# Patient Record
Sex: Female | Born: 1990
Health system: Southern US, Community
[De-identification: ages and names within clinical notes are randomized; demographics above are authoritative.]

## PROBLEM LIST (undated history)

## (undated) ENCOUNTER — Inpatient Hospital Stay (HOSPITAL_COMMUNITY): Payer: Self-pay

## (undated) DIAGNOSIS — F329 Major depressive disorder, single episode, unspecified: Secondary | ICD-10-CM

## (undated) DIAGNOSIS — D649 Anemia, unspecified: Secondary | ICD-10-CM

## (undated) DIAGNOSIS — R51 Headache: Secondary | ICD-10-CM

## (undated) DIAGNOSIS — F419 Anxiety disorder, unspecified: Secondary | ICD-10-CM

## (undated) DIAGNOSIS — F32A Depression, unspecified: Secondary | ICD-10-CM

## (undated) HISTORY — PX: WISDOM TOOTH EXTRACTION: SHX21

---
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
## 2010-01-15 ENCOUNTER — Emergency Department (HOSPITAL_COMMUNITY): Admission: EM | Admit: 2010-01-15 | Discharge: 2010-01-15 | Payer: Self-pay | Admitting: Emergency Medicine

## 2010-04-20 ENCOUNTER — Emergency Department (HOSPITAL_COMMUNITY): Admission: EM | Admit: 2010-04-20 | Discharge: 2010-04-21 | Payer: Self-pay | Admitting: Emergency Medicine

## 2010-08-10 LAB — DIFFERENTIAL
Lymphs Abs: 1.8 10*3/uL (ref 0.7–4.0)
Monocytes Absolute: 0.4 10*3/uL (ref 0.1–1.0)
Neutro Abs: 1.6 10*3/uL — ABNORMAL LOW (ref 1.7–7.7)

## 2010-08-10 LAB — URINALYSIS, ROUTINE W REFLEX MICROSCOPIC
Bilirubin Urine: NEGATIVE
Glucose, UA: NEGATIVE mg/dL
Ketones, ur: NEGATIVE mg/dL
Nitrite: NEGATIVE
Protein, ur: NEGATIVE mg/dL
Specific Gravity, Urine: 1.028 (ref 1.005–1.030)

## 2010-08-10 LAB — BASIC METABOLIC PANEL
BUN: 6 mg/dL (ref 6–23)
GFR calc non Af Amer: >60 mL/min (ref >60)
Glucose, Bld: 105 mg/dL — ABNORMAL HIGH (ref 70–99)
Potassium: 3.4 mEq/L — ABNORMAL LOW (ref 3.5–5.1)
Sodium: 140 mEq/L (ref 135–145)

## 2010-08-10 LAB — URINE MICROSCOPIC-ADD ON

## 2010-08-10 LAB — CBC
MCHC: 33.6 g/dL (ref 30.0–36.0)
Platelets: 198 10*3/uL (ref 150–400)
RBC: 4.33 MIL/uL (ref 3.87–5.11)
WBC: 3.9 10*3/uL — ABNORMAL LOW (ref 4.0–10.5)

## 2010-08-10 LAB — GC/CHLAMYDIA PROBE AMP, GENITAL
Chlamydia, DNA Probe: POSITIVE — AB
GC Probe Amp, Genital: NEGATIVE

## 2010-08-10 LAB — WET PREP, GENITAL
Trich, Wet Prep: NONE SEEN
Yeast Wet Prep HPF POC: NONE SEEN

## 2010-08-10 LAB — POCT PREGNANCY, URINE: Preg Test, Ur: NEGATIVE

## 2010-08-13 LAB — DIFFERENTIAL
Basophils Absolute: 0 10*3/uL (ref 0.0–0.1)
Eosinophils Absolute: 0.1 10*3/uL (ref 0.0–0.7)
Eosinophils Relative: 2 % (ref 0–5)
Lymphocytes Relative: 45 % (ref 12–46)
Neutro Abs: 2.4 10*3/uL (ref 1.7–7.7)

## 2010-08-13 LAB — URINALYSIS, ROUTINE W REFLEX MICROSCOPIC
Bilirubin Urine: NEGATIVE
Glucose, UA: NEGATIVE mg/dL
Ketones, ur: NEGATIVE mg/dL
Protein, ur: NEGATIVE mg/dL
Specific Gravity, Urine: 1.003 — ABNORMAL LOW (ref 1.005–1.030)

## 2010-08-13 LAB — POCT I-STAT, CHEM 8
Calcium, Ion: 1.24 mmol/L (ref 1.12–1.32)
Chloride: 103 mEq/L (ref 96–112)
Creatinine, Ser: 0.7 mg/dL (ref 0.4–1.2)
Hemoglobin: 13.6 g/dL (ref 12.0–15.0)
Potassium: 3.5 mEq/L (ref 3.5–5.1)
TCO2: 24 mmol/L (ref 0–100)

## 2010-08-13 LAB — CBC
MCH: 30.4 pg (ref 26.0–34.0)
MCHC: 34.7 g/dL (ref 30.0–36.0)
MCV: 87.6 fL (ref 78.0–100.0)
Platelets: 174 10*3/uL (ref 150–400)
WBC: 5 10*3/uL (ref 4.0–10.5)

## 2010-08-13 LAB — POCT PREGNANCY, URINE: Preg Test, Ur: NEGATIVE

## 2010-08-13 LAB — WET PREP, GENITAL: Trich, Wet Prep: NONE SEEN

## 2010-10-01 ENCOUNTER — Other Ambulatory Visit: Payer: Self-pay | Admitting: Hematology and Oncology

## 2010-10-01 ENCOUNTER — Encounter (HOSPITAL_BASED_OUTPATIENT_CLINIC_OR_DEPARTMENT_OTHER): Payer: Medicaid Other | Admitting: Hematology and Oncology

## 2010-10-01 DIAGNOSIS — D539 Nutritional anemia, unspecified: Secondary | ICD-10-CM

## 2010-10-01 LAB — URINALYSIS, MICROSCOPIC - CHCC
Bilirubin (Urine): NEGATIVE
Glucose: NEGATIVE g/dL
Ketones: NEGATIVE mg/dL
Nitrite: NEGATIVE
Specific Gravity, Urine: 1.025 (ref 1.003–1.035)
pH: 6 (ref 4.6–8.0)

## 2010-10-01 LAB — CBC & DIFF AND RETIC
Basophils Absolute: 0 10*3/uL (ref 0.0–0.1)
Eosinophils Absolute: 0.1 10*3/uL (ref 0.0–0.5)
HCT: 35.2 % (ref 34.8–46.6)
LYMPH%: 49.5 % (ref 14.0–49.7)
RDW: 12.9 % (ref 11.2–14.5)
Retic %: 0.47 % — ABNORMAL LOW (ref 0.50–1.50)
Retic Ct Abs: 19.79 10*3/uL (ref 18.30–72.70)
lymph#: 2.1 10*3/uL (ref 0.9–3.3)

## 2010-10-01 LAB — MORPHOLOGY: PLT EST: ADEQUATE

## 2010-10-05 LAB — HAPTOGLOBIN: Haptoglobin: 172 mg/dL (ref 16–200)

## 2010-10-05 LAB — PROTEIN ELECTROPHORESIS, SERUM
Alpha-2-Globulin: 9.8 % (ref 7.1–11.8)
Beta Globulin: 7 % (ref 4.7–7.2)
Gamma Globulin: 17.4 % (ref 11.1–18.8)
Total Protein, Serum Electrophoresis: 7.3 g/dL (ref 6.0–8.3)

## 2010-10-05 LAB — IRON AND TIBC
TIBC: 375 ug/dL (ref 250–470)
UIBC: 341 ug/dL

## 2010-10-05 LAB — HEMOGLOBINOPATHY EVALUATION
Hemoglobin Other: 0 % (ref 0.0–0.0)
Hgb A2 Quant: 2.9 % (ref 2.2–3.2)
Hgb A: 97.1 % (ref 96.8–97.8)
Hgb S Quant: 0 % (ref 0.0–0.0)

## 2010-10-05 LAB — COMPREHENSIVE METABOLIC PANEL
Albumin: 4.3 g/dL (ref 3.5–5.2)
BUN: 9 mg/dL (ref 6–23)
Calcium: 9 mg/dL (ref 8.4–10.5)
Chloride: 105 mEq/L (ref 96–112)
Glucose, Bld: 84 mg/dL (ref 70–99)
Potassium: 3.7 mEq/L (ref 3.5–5.3)
Total Protein: 7.3 g/dL (ref 6.0–8.3)

## 2010-10-08 ENCOUNTER — Encounter (HOSPITAL_BASED_OUTPATIENT_CLINIC_OR_DEPARTMENT_OTHER): Payer: Medicaid Other | Admitting: Hematology and Oncology

## 2010-10-08 DIAGNOSIS — D509 Iron deficiency anemia, unspecified: Secondary | ICD-10-CM

## 2010-10-18 ENCOUNTER — Encounter: Payer: Medicaid Other | Admitting: Genetic Counselor

## 2010-11-04 ENCOUNTER — Other Ambulatory Visit: Payer: Self-pay | Admitting: Hematology and Oncology

## 2010-11-04 ENCOUNTER — Encounter (HOSPITAL_BASED_OUTPATIENT_CLINIC_OR_DEPARTMENT_OTHER): Payer: Medicaid Other | Admitting: Hematology and Oncology

## 2010-11-04 DIAGNOSIS — D539 Nutritional anemia, unspecified: Secondary | ICD-10-CM

## 2010-11-04 LAB — CBC WITH DIFFERENTIAL/PLATELET
BASO%: 0.6 % (ref 0.0–2.0)
EOS%: 3.2 % (ref 0.0–7.0)
Eosinophils Absolute: 0.1 10*3/uL (ref 0.0–0.5)
LYMPH%: 56 % — ABNORMAL HIGH (ref 14.0–49.7)
MCH: 30.2 pg (ref 25.1–34.0)
MCHC: 34.5 g/dL (ref 31.5–36.0)
MCV: 87.4 fL (ref 79.5–101.0)
MONO%: 9.1 % (ref 0.0–14.0)
Platelets: 134 10*3/uL — ABNORMAL LOW (ref 145–400)
RBC: 3.67 10*6/uL — ABNORMAL LOW (ref 3.70–5.45)
RDW: 14.1 % (ref 11.2–14.5)

## 2010-11-04 LAB — BASIC METABOLIC PANEL
BUN: 8 mg/dL (ref 6–23)
Calcium: 9.4 mg/dL (ref 8.4–10.5)
Creatinine, Ser: 0.77 mg/dL (ref 0.50–1.10)
Glucose, Bld: 94 mg/dL (ref 70–99)
Potassium: 3.6 mEq/L (ref 3.5–5.3)

## 2010-11-04 LAB — IRON AND TIBC
Iron: 73 ug/dL (ref 42–145)
UIBC: 194 ug/dL

## 2010-11-04 LAB — FERRITIN: Ferritin: 336 ng/mL — ABNORMAL HIGH (ref 10–291)

## 2010-11-30 ENCOUNTER — Other Ambulatory Visit: Payer: Self-pay | Admitting: Hematology and Oncology

## 2010-11-30 ENCOUNTER — Encounter (HOSPITAL_BASED_OUTPATIENT_CLINIC_OR_DEPARTMENT_OTHER): Payer: Medicaid Other | Admitting: Hematology and Oncology

## 2010-11-30 DIAGNOSIS — D539 Nutritional anemia, unspecified: Secondary | ICD-10-CM

## 2010-11-30 DIAGNOSIS — D509 Iron deficiency anemia, unspecified: Secondary | ICD-10-CM

## 2010-11-30 LAB — CBC WITH DIFFERENTIAL/PLATELET
Basophils Absolute: 0 10*3/uL (ref 0.0–0.1)
EOS%: 1.9 % (ref 0.0–7.0)
HCT: 33.8 % — ABNORMAL LOW (ref 34.8–46.6)
HGB: 11.6 g/dL (ref 11.6–15.9)
LYMPH%: 51.2 % — ABNORMAL HIGH (ref 14.0–49.7)
MCH: 30.2 pg (ref 25.1–34.0)
NEUT%: 38.2 % — ABNORMAL LOW (ref 38.4–76.8)
Platelets: 176 10*3/uL (ref 145–400)
lymph#: 1.8 10*3/uL (ref 0.9–3.3)

## 2011-05-18 ENCOUNTER — Telehealth: Payer: Self-pay | Admitting: *Deleted

## 2011-05-18 ENCOUNTER — Other Ambulatory Visit: Payer: Self-pay | Admitting: *Deleted

## 2011-05-18 DIAGNOSIS — D539 Nutritional anemia, unspecified: Secondary | ICD-10-CM

## 2011-05-18 NOTE — Telephone Encounter (Signed)
Patient feeling weak and tired again as when she 1st saw Dr. Dalene Carrow. Has been out of her prescription iron for past 3 months. Needs appointment to see MD and refill.  Has FTKA for #2 prior appointments since last visit. Will have her come in tomorrow for the repeat labs MD needed and then schedule follow up based on these results.

## 2011-05-19 ENCOUNTER — Telehealth: Payer: Self-pay | Admitting: Oncology

## 2011-05-19 ENCOUNTER — Other Ambulatory Visit: Payer: Medicaid Other | Admitting: Lab

## 2011-05-19 ENCOUNTER — Ambulatory Visit (HOSPITAL_BASED_OUTPATIENT_CLINIC_OR_DEPARTMENT_OTHER): Payer: Medicaid Other

## 2011-05-19 DIAGNOSIS — D539 Nutritional anemia, unspecified: Secondary | ICD-10-CM

## 2011-05-19 LAB — FERRITIN: Ferritin: 186 ng/mL (ref 10–291)

## 2011-05-19 LAB — CBC WITH DIFFERENTIAL/PLATELET
Eosinophils Absolute: 0.1 10*3/uL (ref 0.0–0.5)
HCT: 35.5 % (ref 34.8–46.6)
LYMPH%: 55.1 % — ABNORMAL HIGH (ref 14.0–49.7)
MONO#: 0.2 10*3/uL (ref 0.1–0.9)
NEUT#: 1.2 10*3/uL — ABNORMAL LOW (ref 1.5–6.5)
Platelets: 152 10*3/uL (ref 145–400)
RBC: 3.94 10*6/uL (ref 3.70–5.45)
WBC: 3.2 10*3/uL — ABNORMAL LOW (ref 3.9–10.3)

## 2011-05-19 LAB — IRON AND TIBC
%SAT: 24 % (ref 20–55)
TIBC: 285 ug/dL (ref 250–470)
UIBC: 216 ug/dL (ref 125–400)

## 2011-05-19 LAB — BASIC METABOLIC PANEL
Calcium: 9.3 mg/dL (ref 8.4–10.5)
Chloride: 105 mEq/L (ref 96–112)
Creatinine, Ser: 0.8 mg/dL (ref 0.50–1.10)

## 2011-05-19 NOTE — Telephone Encounter (Signed)
S/w the pt and she is aware of her lab appt for today

## 2011-05-30 ENCOUNTER — Telehealth: Payer: Self-pay | Admitting: Nurse Practitioner

## 2011-05-30 NOTE — Telephone Encounter (Signed)
Pt called requesting lab result from 12/20.  Informed pt that CBC showed normal hemoglobin.  Pt requested refill of iron prescribed by Dr. Dalene Carrow.  Informed pt that this office unable to refill medication until she is seen due to not showing at previously scheduled appointments.  Let pt know request for followup appointment would be sent to Dr. Dalene Carrow and this office will call her with appt as ordered.  Pt verbalized understanding.

## 2011-05-31 NOTE — L&D Delivery Note (Signed)
Delivery Note At 12:58 AM [name] was delivered via Vaginal, Spontaneous Delivery (Presentation: Left Occiput Posterior).    Placenta status: Intact,delivered w/cord traction .  Cord: 3 vessels with the following complications: None.    Anesthesia:  Epidural, local  Episiotomy:  none Lacerations:  2nd degree Suture Repair: 2.0 vicryl rapide Est. Blood Loss (mL):  200 ml  Mom to postpartum.  Baby to nursery-stable.  JACKSON-MOORE,Tracey Whitney 02/23/2012, 1:31 AM

## 2011-06-01 ENCOUNTER — Other Ambulatory Visit: Payer: Self-pay | Admitting: Hematology and Oncology

## 2011-06-06 ENCOUNTER — Telehealth: Payer: Self-pay | Admitting: Hematology and Oncology

## 2011-06-06 NOTE — Telephone Encounter (Signed)
Not able to reach pt by phone due to primary phone d/c and vm 2nd # not set up. Jan appt schedule mailed today.

## 2011-06-14 ENCOUNTER — Other Ambulatory Visit: Payer: Self-pay | Admitting: *Deleted

## 2011-06-14 DIAGNOSIS — D539 Nutritional anemia, unspecified: Secondary | ICD-10-CM

## 2011-06-15 ENCOUNTER — Ambulatory Visit: Payer: Medicaid Other | Admitting: Physician Assistant

## 2011-06-15 ENCOUNTER — Other Ambulatory Visit: Payer: Medicaid Other

## 2011-10-10 ENCOUNTER — Inpatient Hospital Stay (HOSPITAL_COMMUNITY)
Admission: AD | Admit: 2011-10-10 | Discharge: 2011-10-10 | Disposition: A | Discharge status: Hospice | Payer: Medicaid Other | Source: Ambulatory Visit | Attending: Obstetrics & Gynecology | Admitting: Obstetrics & Gynecology

## 2011-10-10 ENCOUNTER — Encounter (HOSPITAL_COMMUNITY): Payer: Self-pay | Admitting: *Deleted

## 2011-10-10 DIAGNOSIS — R112 Nausea with vomiting, unspecified: Secondary | ICD-10-CM

## 2011-10-10 DIAGNOSIS — O210 Mild hyperemesis gravidarum: Secondary | ICD-10-CM | POA: Insufficient documentation

## 2011-10-10 HISTORY — DX: Headache: R51

## 2011-10-10 HISTORY — DX: Anemia, unspecified: D64.9

## 2011-10-10 LAB — URINALYSIS, ROUTINE W REFLEX MICROSCOPIC
Hgb urine dipstick: NEGATIVE
Leukocytes, UA: NEGATIVE
Nitrite: NEGATIVE
Specific Gravity, Urine: 1.01 (ref 1.005–1.030)
Urobilinogen, UA: 0.2 mg/dL (ref 0.0–1.0)

## 2011-10-10 MED ORDER — ONDANSETRON 8 MG PO TBDP: 8.0000 mg | ORAL_TABLET | Freq: Three times a day (TID) | ORAL | Status: AC | PRN

## 2011-10-10 MED ORDER — ONDANSETRON 8 MG PO TBDP
8.0000 mg | ORAL_TABLET | Freq: Once | ORAL | Status: AC
  Administered 2011-10-10: 8 mg via ORAL
  Filled 2011-10-10: qty 1

## 2011-10-10 NOTE — Discharge Instructions (Signed)
B.R.A.T. Diet  Your doctor has recommended the B.R.A.T. diet for you or your child until the condition improves. This is often used to help control diarrhea and vomiting symptoms. If you or your child can tolerate clear liquids, you may have:   Bananas.   Rice.   Applesauce.   Toast (and other simple starches such as crackers, potatoes, noodles).  Be sure to avoid dairy products, meats, and fatty foods until symptoms are better. Fruit juices such as apple, grape, and prune juice can make diarrhea worse. Avoid these. Continue this diet for 2 days or as instructed by your caregiver.  Document Released: 05/16/2005 Document Revised: 05/05/2011 Document Reviewed: 11/02/2006  Exit[employer] Patient Information 2012 [employer].    B.R.A.T. Diet  Your doctor has recommended the B.R.A.T. diet for you or your child until the condition improves. This is often used to help control diarrhea and vomiting symptoms. If you or your child can tolerate clear liquids, you may have:   Bananas.   Rice.   Applesauce.   Toast (and other simple starches such as crackers, potatoes, noodles).  Be sure to avoid dairy products, meats, and fatty foods until symptoms are better. Fruit juices such as apple, grape, and prune juice can make diarrhea worse. Avoid these. Continue this diet for 2 days or as instructed by your caregiver.  Document Released: 05/16/2005 Document Revised: 05/05/2011 Document Reviewed: 11/02/2006  [employer] Patient Information 2012 [employer].

## 2011-10-10 NOTE — Progress Notes (Signed)
   List of prenatal providers given.   Prenatal Care [facility]    [facility]  [phone]     Phone: [phone]          [facility]                      [facility]  @[place]     Phone: [phone]  Phone: [phone]         [facility]     Phone: [phone]  Phone: [phone]           [facility] [place]                hone: [phone]  Phone: [phone]         [facility] Dr. Francoise Ceo      Phone: [phone]  Phone: [phone]         [facility] Dept[facility]                Phone: [phone]  [facility]   Phone:[phone]    Family [address] [place])          Phone: [phone] [facility]   Phone: [phone]

## 2011-10-10 NOTE — MAU Provider Note (Signed)
  History     CSN: [id number]  Arrival date and time: 10/10/11 6644   First Provider Initiated Contact with Patient 10/10/11 2111      Chief Complaint  Patient presents with  . Nausea   HPI Pt is here with report of nausea x 10 days.  Reports drinking a drink 7 days ago at [employer] that contained alcohol.  Pt states that she was unaware that the drink contained alcohol and she has been sick every day since.  Report vomiting 3-4 x a days since the occurrence.  Denies fever, body aches, or chills.  No reports of bleeding or leaking of fluid.    Past Medical History  Diagnosis Date  . Headache   . Anemia     Past Surgical History  Procedure Date  . Wisdom tooth extraction     Family History  Problem Relation Age of Onset  . Cancer Paternal Aunt     History  Substance Use Topics  . Smoking status: Never Smoker   . Smokeless tobacco: Not on file  . Alcohol Use: No    Allergies: No Known Allergies  Prescriptions prior to admission  Medication Sig Dispense Refill  . butalbital-acetaminophen-caffeine (FIORICET, ESGIC) 50-325-40 MG per tablet Take 1 tablet by mouth 2 (two) times daily as needed. For migraines.      . Prenatal Vit-Fe Fumarate-FA (PRENATAL MULTIVITAMIN) TABS Take 1 tablet by mouth at bedtime.        Review of Systems  Gastrointestinal: Positive for nausea and vomiting.  All other systems reviewed and are negative.   Physical Exam   Blood pressure 105/57, pulse 75, temperature 98 F (36.7 C), temperature source Oral, resp. rate 18, height 5\' 10"  (1.778 m), weight 73.936 kg (163 lb), SpO2 100.00%.  Physical Exam  Constitutional: She is oriented to person, place, and time. She appears well-developed and well-nourished. No distress.  HENT:  Head: Normocephalic.  Mouth/Throat: Mucous membranes are normal. Mucous membranes are not dry.  Neck: Normal range of motion. Neck supple.  Cardiovascular: Normal rate, regular rhythm and normal heart sounds.     Respiratory: Effort normal and breath sounds normal.  GI: Soft. There is no tenderness.       Hyperactive bowel sounds  Genitourinary: No bleeding around the vagina.  Neurological: She is alert and oriented to person, place, and time.  Skin: Skin is warm and dry.    MAU Course  Procedures Results for orders placed during the hospital encounter of 10/10/11 (from the past 24 hour(s))  URINALYSIS, ROUTINE W REFLEX MICROSCOPIC     Status: Normal   Collection Time   10/10/11  7:30 PM      Component Value Range   Color, Urine YELLOW  YELLOW    APPearance CLEAR  CLEAR    Specific Gravity, Urine 1.010  1.005 - 1.030    pH 7.0  5.0 - 8.0    Glucose, UA NEGATIVE  NEGATIVE (mg/dL)   Hgb urine dipstick NEGATIVE  NEGATIVE    Bilirubin Urine NEGATIVE  NEGATIVE    Ketones, ur NEGATIVE  NEGATIVE (mg/dL)   Protein, ur NEGATIVE  NEGATIVE (mg/dL)   Urobilinogen, UA 0.2  0.0 - 1.0 (mg/dL)   Nitrite NEGATIVE  NEGATIVE    Leukocytes, UA NEGATIVE  NEGATIVE    Doppler FHR 140's  Assessment and Plan  Nausea and Vomiting - stable  Plan: DC to home RX Zofran BRAT diet  [facility] 10/10/2011, 9:15 PM

## 2011-10-10 NOTE — MAU Note (Signed)
Pt reports that the other day she drank a mixed drink that she thought did not have alcohol but was later told that it did contain alcohol and since that time she has been nauseated. Vomiting 3-4 times per day. Was getting prenatal care in [place] and has move here in the last week.

## 2011-10-13 NOTE — MAU Provider Note (Signed)
Medical Screening exam and patient care preformed by advanced practice provider.  Agree with the above management.  

## 2011-11-17 ENCOUNTER — Inpatient Hospital Stay (HOSPITAL_COMMUNITY)
Admission: AD | Admit: 2011-11-17 | Discharge: 2011-11-17 | Disposition: A | Discharge status: Home / Self Care | Payer: Medicaid Other | Source: Ambulatory Visit | Attending: Obstetrics & Gynecology | Admitting: Obstetrics & Gynecology

## 2011-11-17 ENCOUNTER — Encounter (HOSPITAL_COMMUNITY): Payer: Self-pay | Admitting: *Deleted

## 2011-11-17 DIAGNOSIS — N949 Unspecified condition associated with female genital organs and menstrual cycle: Secondary | ICD-10-CM

## 2011-11-17 DIAGNOSIS — R109 Unspecified abdominal pain: Secondary | ICD-10-CM

## 2011-11-17 DIAGNOSIS — K5900 Constipation, unspecified: Secondary | ICD-10-CM | POA: Insufficient documentation

## 2011-11-17 DIAGNOSIS — O99891 Other specified diseases and conditions complicating pregnancy: Secondary | ICD-10-CM | POA: Insufficient documentation

## 2011-11-17 LAB — URINALYSIS, ROUTINE W REFLEX MICROSCOPIC
Ketones, ur: NEGATIVE mg/dL
Leukocytes, UA: NEGATIVE
Nitrite: NEGATIVE
Specific Gravity, Urine: 1.01 (ref 1.005–1.030)
pH: 7 (ref 5.0–8.0)

## 2011-11-17 LAB — WET PREP, GENITAL: Yeast Wet Prep HPF POC: NONE SEEN

## 2011-11-17 NOTE — MAU Provider Note (Signed)
History     CSN: [id number]  Arrival date & time 11/17/11  2010   None     Chief Complaint  Patient presents with  . Vaginal Bleeding    HPI Tracey Whitney is a 21 y.o. female @ [redacted]w[redacted]d gestation who presents to MAU for abdominal cramping. The cramping started 3 days ago. The pain comes and goes, she rates the pain as 8/10 when it comes. The pain at this time in on the sides of the abdomen is a 7/10. The pain is worse when up moving a lot and decreases with rest. Has not taken anything for pain. Prenatal care with Dr. Tamela Oddi. Last office visit 2 weeks ago and everything was fine. The history was provided by the patient.   Past Medical History  Diagnosis Date  . Headache   . Anemia     Past Surgical History  Procedure Date  . Wisdom tooth extraction     Family History  Problem Relation Age of Onset  . Cancer Paternal Aunt     History  Substance Use Topics  . Smoking status: Never Smoker   . Smokeless tobacco: Not on file  . Alcohol Use: No    OB History    Grav Para Term Preterm Abortions TAB SAB Ect Mult Living   2 1 1       1       Review of Systems  Constitutional: Negative for fever, chills, diaphoresis and fatigue.  HENT: Negative for ear pain, congestion, sore throat, facial swelling, neck pain, neck stiffness, dental problem and sinus pressure.   Eyes: Negative for photophobia, pain, discharge and visual disturbance.  Respiratory: Negative for cough, chest tightness and wheezing.   Cardiovascular: Negative for chest pain, palpitations and leg swelling.  Gastrointestinal: Positive for abdominal pain and constipation. Negative for nausea, vomiting, diarrhea and abdominal distention.  Genitourinary: Negative for dysuria, frequency, flank pain, vaginal bleeding, vaginal discharge, difficulty urinating and pelvic pain.  Musculoskeletal: Positive for back pain. Negative for myalgias and gait problem.  Skin: Negative for color change and rash.  Neurological:  Negative for dizziness, speech difficulty, weakness, light-headedness, numbness and headaches.  Psychiatric/Behavioral: Negative for confusion and agitation. The patient is not nervous/anxious.     Allergies  Review of patient's allergies indicates no known allergies.  Home Medications  No current outpatient prescriptions on file.  BP 108/53  Pulse 78  Temp 97.6 F (36.4 C) (Oral)  Resp 18  SpO2 100%  Physical Exam  Nursing note and vitals reviewed. Constitutional: She is oriented to person, place, and time. She appears well-developed and well-nourished. No distress.  HENT:  Head: Normocephalic.  Eyes: EOM are normal.  Neck: Neck supple.  Cardiovascular: Normal rate and regular rhythm.   Pulmonary/Chest: Effort normal and breath sounds normal.  Abdominal: Soft.       Minimal tenderness bilateral consistent with round ligament pain.  Gravid consistent with dates.  Genitourinary:       External genitalia without lesions. White discharge vaginal vault. Cervix closed, thick, posterior. Uterus consistent with dates  Musculoskeletal: Normal range of motion.  Neurological: She is alert and oriented to person, place, and time. No cranial nerve deficit.  Skin: Skin is warm and dry.  Psychiatric: She has a normal mood and affect. Her behavior is normal. Judgment and thought content normal.   Results for orders placed during the hospital encounter of 11/17/11 (from the past 24 hour(s))  URINALYSIS, ROUTINE W REFLEX MICROSCOPIC     Status: Normal  Collection Time   11/17/11  8:25 PM      Component Value Range   Color, Urine YELLOW  YELLOW   APPearance CLEAR  CLEAR   Specific Gravity, Urine 1.010  1.005 - 1.030   pH 7.0  5.0 - 8.0   Glucose, UA NEGATIVE  NEGATIVE mg/dL   Hgb urine dipstick NEGATIVE  NEGATIVE   Bilirubin Urine NEGATIVE  NEGATIVE   Ketones, ur NEGATIVE  NEGATIVE mg/dL   Protein, ur NEGATIVE  NEGATIVE mg/dL   Urobilinogen, UA 0.2  0.0 - 1.0 mg/dL   Nitrite NEGATIVE   NEGATIVE   Leukocytes, UA NEGATIVE  NEGATIVE   EFM:  Baseline 150, no contractions, no decelerations, normal tracing for gestational age.  ED Course  Procedures  Assessment: 21 y.o. female @ [redacted]w[redacted]d gestation with round ligament pain   Constipation  Plan:  Tylenol as needed for discomfort   Diet for constipation   Follow up in the office   Return as needed. MDM

## 2011-11-17 NOTE — MAU Note (Signed)
Patient states she noticed some vaginal bleeding when she wiped after using the bathroom around 630pm. Also complains of back pain and abdominal cramping x 2 days.

## 2011-11-17 NOTE — Discharge Instructions (Signed)
Abdominal Pain During Pregnancy Abdominal discomfort is common in pregnancy. Most of the time, it does not cause harm. There are many causes of abdominal pain. Some causes are more serious than others. Some of the causes of abdominal pain in pregnancy are easily diagnosed. Occasionally, the diagnosis takes time to understand. Other times, the cause is not determined. Abdominal pain can be a sign that something is very wrong with the pregnancy, or the pain may have nothing to do with the pregnancy at all. For this reason, always tell your caregiver if you have any abdominal discomfort. CAUSES Common and harmless causes of abdominal pain include:  Constipation.   Excess gas and bloating.   Round ligament pain. This is pain that is felt in the folds of the groin.   The position the baby or placenta is in.   Baby kicks.   Braxton-Hicks contractions. These are mild contractions that do not cause cervical dilation.  Serious causes of abdominal pain include:  Ectopic pregnancy. This happens when a fertilized egg implants outside of the uterus.   Miscarriage.   Preterm labor. This is when labor starts at less than 37 weeks of pregnancy.   Placental abruption. This is when the placenta partially or completely separates from the uterus.   Preeclampsia. This is often associated with high blood pressure and has been referred to as "toxemia in pregnancy."   Uterine or amniotic fluid infections.  Causes unrelated to pregnancy include:  Urinary tract infection.   Gallbladder stones or inflammation.   Hepatitis or other liver illness.   Intestinal problems, stomach flu, food poisoning, or ulcer.   Appendicitis.   Kidney (renal) stones.   Kidney infection (pylonephritis).  HOME CARE INSTRUCTIONS  For mild pain:  Do not have sexual intercourse or put anything in your vagina until your symptoms go away completely.   Get plenty of rest until your pain improves. If your pain does not  improve in 1 hour, call your caregiver.   Drink clear fluids if you feel nauseous. Avoid solid food as long as you are uncomfortable or nauseous.   Only take medicine as directed by your caregiver.   Keep all follow-up appointments with your caregiver.  SEEK IMMEDIATE MEDICAL CARE IF:  You are bleeding, leaking fluid, or passing tissue from the vagina.   You have increasing pain or cramping.   You have persistent vomiting.   You have painful or bloody urination.   You have a fever.   You notice a decrease in your baby's movements.   You have extreme weakness or feel faint.   You have shortness of breath, with or without abdominal pain.   You develop a severe headache with abdominal pain.   You have abnormal vaginal discharge with abdominal pain.   You have persistent diarrhea.   You have abdominal pain that continues even after rest, or gets worse.  MAKE SURE YOU:   Understand these instructions.   Will watch your condition.   Will get help right away if you are not doing well or get worse.  Document Released: 05/16/2005 Document Revised: 05/05/2011 Document Reviewed: 12/10/2010 [facility] Patient Information 2012 [place], [place].Abdominal Pain During Pregnancy Abdominal discomfort is common in pregnancy. Most of the time, it does not cause harm. There are many causes of abdominal pain. Some causes are more serious than others. Some of the causes of abdominal pain in pregnancy are easily diagnosed. Occasionally, the diagnosis takes time to understand. Other times, the cause is not determined.  Abdominal pain can be a sign that something is very wrong with the pregnancy, or the pain may have nothing to do with the pregnancy at all. For this reason, always tell your caregiver if you have any abdominal discomfort. CAUSES Common and harmless causes of abdominal pain include:  Constipation.   Excess gas and bloating.   Round ligament pain. This is pain that is felt in the  folds of the groin.   The position the baby or placenta is in.   Baby kicks.   Braxton-Hicks contractions. These are mild contractions that do not cause cervical dilation.  Serious causes of abdominal pain include:  Ectopic pregnancy. This happens when a fertilized egg implants outside of the uterus.   Miscarriage.   Preterm labor. This is when labor starts at less than 37 weeks of pregnancy.   Placental abruption. This is when the placenta partially or completely separates from the uterus.   Preeclampsia. This is often associated with high blood pressure and has been referred to as "toxemia in pregnancy."   Uterine or amniotic fluid infections.  Causes unrelated to pregnancy include:  Urinary tract infection.   Gallbladder stones or inflammation.   Hepatitis or other liver illness.   Intestinal problems, stomach flu, food poisoning, or ulcer.   Appendicitis.   Kidney (renal) stones.   Kidney infection (pylonephritis).  HOME CARE INSTRUCTIONS  For mild pain:  Do not have sexual intercourse or put anything in your vagina until your symptoms go away completely.   Get plenty of rest until your pain improves. If your pain does not improve in 1 hour, call your caregiver.   Drink clear fluids if you feel nauseous. Avoid solid food as long as you are uncomfortable or nauseous.   Only take medicine as directed by your caregiver.   Keep all follow-up appointments with your caregiver.  SEEK IMMEDIATE MEDICAL CARE IF:  You are bleeding, leaking fluid, or passing tissue from the vagina.   You have increasing pain or cramping.   You have persistent vomiting.   You have painful or bloody urination.   You have a fever.   You notice a decrease in your baby's movements.   You have extreme weakness or feel faint.   You have shortness of breath, with or without abdominal pain.   You develop a severe headache with abdominal pain.   You have abnormal vaginal discharge  with abdominal pain.   You have persistent diarrhea.   You have abdominal pain that continues even after rest, or gets worse.  MAKE SURE YOU:   Understand these instructions.   Will watch your condition.   Will get help right away if you are not doing well or get worse.  Document Released: 05/16/2005 Document Revised: 05/05/2011 Document Reviewed: 12/10/2010 [facility] Patient Information 2012 [place], [place].

## 2011-11-18 LAB — GC/CHLAMYDIA PROBE AMP, GENITAL: Chlamydia, DNA Probe: NEGATIVE

## 2011-11-23 ENCOUNTER — Inpatient Hospital Stay (HOSPITAL_COMMUNITY)
Admission: AD | Admit: 2011-11-23 | Discharge: 2011-11-23 | Disposition: A | Discharge status: Home / Self Care | Payer: Medicaid Other | Source: Ambulatory Visit | Attending: Obstetrics & Gynecology | Admitting: Obstetrics & Gynecology

## 2011-11-23 ENCOUNTER — Encounter (HOSPITAL_COMMUNITY): Payer: Self-pay | Admitting: Obstetrics and Gynecology

## 2011-11-23 DIAGNOSIS — O2650 Maternal hypotension syndrome, unspecified trimester: Secondary | ICD-10-CM | POA: Insufficient documentation

## 2011-11-23 DIAGNOSIS — I951 Orthostatic hypotension: Secondary | ICD-10-CM

## 2011-11-23 DIAGNOSIS — R55 Syncope and collapse: Secondary | ICD-10-CM

## 2011-11-23 LAB — URINALYSIS, ROUTINE W REFLEX MICROSCOPIC
Glucose, UA: NEGATIVE mg/dL
Ketones, ur: NEGATIVE mg/dL
Leukocytes, UA: NEGATIVE
pH: 6.5 (ref 5.0–8.0)

## 2011-11-23 MED ORDER — LACTATED RINGERS IV SOLN
Freq: Once | INTRAVENOUS | Status: AC
  Administered 2011-11-23: 19:00:00 via INTRAVENOUS

## 2011-11-23 NOTE — MAU Note (Addendum)
At work today (around 1345) and passed out.  Prior to that happening, was standing at the cash register, vision started blurring. Was sweating.  When blacked out hit knee and left side. Head and eyes hurt, no longer blurring.

## 2011-11-23 NOTE — MAU Provider Note (Signed)
History     CSN: [id number]  Arrival date and time: 11/23/11 1711   First Provider Initiated Contact with Patient 11/23/11 1839      Chief Complaint  Patient presents with  . Loss of Consciousness   HPI  Tracey Whitney 21 y.o. [redacted]w[redacted]d patient of Dr. Tamela Oddi presenting today post syncopal episode today at work around 1345.  Patient is a Associate Professor at [employer].  States she works 10 hour shifts on her feet.  States she felt a little lightheaded like she might pass out, but went and drank some water and went back to work.  States became diaphoretic and then fell when she passed out hurting her L knee and hip. States did eat breakfast lunch and dinner and tries to drink enough water but perhaps did not today.  Also reports she had a headache today for which she took a Fioricet.  States history of syncopal episodes with prior pregnancy and tends to get lightheaded if she stands up too quickly.  OB History    Grav Para Term Preterm Abortions TAB SAB Ect Mult Living   2 1 1       1       Past Medical History  Diagnosis Date  . Headache   . Anemia     Past Surgical History  Procedure Date  . Wisdom tooth extraction     Family History  Problem Relation Age of Onset  . Cancer Paternal Aunt     History  Substance Use Topics  . Smoking status: Never Smoker   . Smokeless tobacco: Not on file  . Alcohol Use: No    Allergies: No Known Allergies  Prescriptions prior to admission  Medication Sig Dispense Refill  . butalbital-acetaminophen-caffeine (FIORICET, ESGIC) 50-325-40 MG per tablet Take 1 tablet by mouth 2 (two) times daily as needed. For migraines      . Prenatal Vit-Fe Fumarate-FA (PRENATAL MULTIVITAMIN) TABS Take 1 tablet by mouth at bedtime.        ROS Physical Exam   Blood pressure 99/46, pulse 121, temperature 98.7 F (37.1 C), temperature source Oral, resp. rate 20, height 5\' 8"  (1.727 m), weight 78.472 kg (173 lb).  Physical Exam  MAU Course    Procedures  MDM  Results for orders placed during the hospital encounter of 11/23/11 (from the past 24 hour(s))  URINALYSIS, ROUTINE W REFLEX MICROSCOPIC     Status: Normal   Collection Time   11/23/11  5:25 PM      Component Value Range   Color, Urine YELLOW  YELLOW   APPearance CLEAR  CLEAR   Specific Gravity, Urine 1.010  1.005 - 1.030   pH 6.5  5.0 - 8.0   Glucose, UA NEGATIVE  NEGATIVE mg/dL   Hgb urine dipstick NEGATIVE  NEGATIVE   Bilirubin Urine NEGATIVE  NEGATIVE   Ketones, ur NEGATIVE  NEGATIVE mg/dL   Protein, ur NEGATIVE  NEGATIVE mg/dL   Urobilinogen, UA 0.2  0.0 - 1.0 mg/dL   Nitrite NEGATIVE  NEGATIVE   Leukocytes, UA NEGATIVE  NEGATIVE  GLUCOSE, CAPILLARY     Status: Abnormal   Collection Time   11/23/11  7:35 PM      Component Value Range   Glucose-Capillary 104 (*) 70 - 99 mg/dL   Spoke with Dr. Tamela Oddi.  Discussed plan of care.  Patient to keep upcoming appointment and follow up in office on July 2.  Care of patient assumed by [place] CNM at 2000.  Assessment  and Plan   Assessment:  Syncopal Episode  Plan: IVF challenge - 1000cc LR over 1 hour.  Repeat orthostatics.   Check CBG. Keep appointment with Dr. Tamela Oddi on July 2.  I agree with the exam and documentation, Nolene Bernheim, RN, FNP  Servando Salina 11/23/2011, 7:51 PM

## 2011-12-06 LAB — OB RESULTS CONSOLE HEPATITIS B SURFACE ANTIGEN: Hepatitis B Surface Ag: NEGATIVE

## 2011-12-06 LAB — OB RESULTS CONSOLE ANTIBODY SCREEN: Antibody Screen: NEGATIVE

## 2011-12-06 LAB — OB RESULTS CONSOLE ABO/RH: RH Type: POSITIVE

## 2012-01-10 ENCOUNTER — Inpatient Hospital Stay (HOSPITAL_COMMUNITY)
Admission: AD | Admit: 2012-01-10 | Discharge: 2012-01-10 | Disposition: A | Discharge status: Home / Self Care | Payer: Medicaid Other | Source: Ambulatory Visit | Attending: Obstetrics | Admitting: Obstetrics

## 2012-01-10 ENCOUNTER — Encounter (HOSPITAL_COMMUNITY): Payer: Self-pay | Admitting: *Deleted

## 2012-01-10 DIAGNOSIS — O4700 False labor before 37 completed weeks of gestation, unspecified trimester: Secondary | ICD-10-CM

## 2012-01-10 LAB — URINALYSIS, ROUTINE W REFLEX MICROSCOPIC
Hgb urine dipstick: NEGATIVE
Leukocytes, UA: NEGATIVE
Nitrite: NEGATIVE
Specific Gravity, Urine: <1.005 — ABNORMAL LOW (ref 1.005–1.030)
Urobilinogen, UA: 0.2 mg/dL (ref 0.0–1.0)

## 2012-01-10 LAB — WET PREP, GENITAL
Trich, Wet Prep: NONE SEEN
Yeast Wet Prep HPF POC: NONE SEEN

## 2012-01-10 LAB — FETAL FIBRONECTIN: Fetal Fibronectin: NEGATIVE

## 2012-01-10 NOTE — MAU Provider Note (Signed)
Chief Complaint:  Abdominal Cramping   None     HPI  Tracey Whitney is a 21 y.o. G2P1001 at [redacted]w[redacted]d presenting with contractions/cramping every 10 minutes x3 days.  She reports the symptoms first occurred after standing for 8 hours at her job on Friday, but have continued all weekend off and on.  She reports good fetal movement, denies LOF, vaginal bleeding, vaginal itching/burning, urinary symptoms, h/a, dizziness, n/v, or fever/chills.    Pregnancy Course: uncomplicated  Past Medical History: Past Medical History  Diagnosis Date  . Headache   . Anemia     Past Surgical History: Past Surgical History  Procedure Date  . Wisdom tooth extraction     Family History: Family History  Problem Relation Age of Onset  . Cancer Paternal Aunt     Social History: History  Substance Use Topics  . Smoking status: Never Smoker   . Smokeless tobacco: Not on file  . Alcohol Use: No    Allergies:  Allergies  Allergen Reactions  . Latex Itching    Meds:  Prescriptions prior to admission  Medication Sig Dispense Refill  . acetaminophen (TYLENOL) 500 MG tablet Take 500 mg by mouth every 6 (six) hours as needed.      . butalbital-acetaminophen-caffeine (FIORICET, ESGIC) 50-325-40 MG per tablet Take 1 tablet by mouth 2 (two) times daily as needed. For migraines      . calcium carbonate (TUMS - DOSED IN MG ELEMENTAL CALCIUM) 500 MG chewable tablet Chew 1 tablet by mouth daily.      . Prenatal Vit-Fe Fumarate-FA (PRENATAL MULTIVITAMIN) TABS Take 1 tablet by mouth at bedtime.          Physical Exam  Blood pressure 107/71, pulse 89, temperature 97.7 F (36.5 C), temperature source Oral, resp. rate 20, height 5\' 10"  (1.778 m), weight 86.297 kg (190 lb 4 oz). GENERAL: Well-developed, well-nourished female in no acute distress.  HEENT: normocephalic, good dentition HEART: normal rate RESP: normal effort ABDOMEN: Soft, nontender, gravid appropriate for gestational age EXTREMITIES:  Nontender, no edema NEURO: alert and oriented  Pelvic exam: Cervix pink, visually closed, without lesion, moderate amount white/yellow creamy discharge, vaginal walls and external genitalia normal  Dilation: 1 Effacement (%): Thick Cervical Position: Posterior Station: -3 Exam by:: L. Craige Cotta CNM  FHT:  Baseline 135 , moderate variability, accelerations present, no decelerations Contractions: irritability   Labs: Results for orders placed during the hospital encounter of 01/10/12 (from the past 24 hour(s))  URINALYSIS, ROUTINE W REFLEX MICROSCOPIC     Status: Abnormal   Collection Time   01/10/12  3:29 AM      Component Value Range   Color, Urine YELLOW  YELLOW   APPearance CLEAR  CLEAR   Specific Gravity, Urine <1.005 (*) 1.005 - 1.030   pH 6.5  5.0 - 8.0   Glucose, UA NEGATIVE  NEGATIVE mg/dL   Hgb urine dipstick NEGATIVE  NEGATIVE   Bilirubin Urine NEGATIVE  NEGATIVE   Ketones, ur NEGATIVE  NEGATIVE mg/dL   Protein, ur NEGATIVE  NEGATIVE mg/dL   Urobilinogen, UA 0.2  0.0 - 1.0 mg/dL   Nitrite NEGATIVE  NEGATIVE   Leukocytes, UA NEGATIVE  NEGATIVE  WET PREP, GENITAL     Status: Abnormal   Collection Time   01/10/12  4:04 AM      Component Value Range   Yeast Wet Prep HPF POC NONE SEEN  NONE SEEN   Trich, Wet Prep NONE SEEN  NONE SEEN   Clue  Cells Wet Prep HPF POC NONE SEEN  NONE SEEN   WBC, Wet Prep HPF POC MODERATE (*) NONE SEEN  FETAL FIBRONECTIN     Status: Normal   Collection Time   01/10/12  4:04 AM      Component Value Range   Fetal Fibronectin NEGATIVE  NEGATIVE     Assessment: Threatened preterm labor   Plan: D/C home with PTL precautions F/U with Dr Clearance Coots May take Tylenol for pain, Benadryl to assist with sleep Drink plenty of fluids Return to MAU as needed   LEFTWICH-KIRBY, LISA 8/13/20134:03 AM

## 2012-01-10 NOTE — MAU Note (Signed)
Abdominal cramping that moves to the back. Pain comes and goes every 10 mins. Vaginal pressure.

## 2012-02-02 ENCOUNTER — Encounter (HOSPITAL_COMMUNITY): Payer: Self-pay | Admitting: *Deleted

## 2012-02-02 ENCOUNTER — Inpatient Hospital Stay (HOSPITAL_COMMUNITY)
Admission: AD | Admit: 2012-02-02 | Discharge: 2012-02-02 | Disposition: A | Discharge status: Home / Self Care | Payer: Medicaid Other | Source: Ambulatory Visit | Attending: Obstetrics | Admitting: Obstetrics

## 2012-02-02 DIAGNOSIS — O99891 Other specified diseases and conditions complicating pregnancy: Secondary | ICD-10-CM | POA: Insufficient documentation

## 2012-02-02 DIAGNOSIS — N949 Unspecified condition associated with female genital organs and menstrual cycle: Secondary | ICD-10-CM | POA: Insufficient documentation

## 2012-02-02 DIAGNOSIS — O26899 Other specified pregnancy related conditions, unspecified trimester: Secondary | ICD-10-CM

## 2012-02-02 MED ORDER — COMFORT FIT MATERNITY SUPP SM MISC: 1.0000 [IU] | Status: DC | PRN

## 2012-02-02 NOTE — MAU Note (Cosign Needed)
Pt fell getting out of the shower last night. Pt states she has been having trouble bearing weight since then and is having pain in pelvic area

## 2012-02-02 NOTE — Progress Notes (Signed)
Pt fell yesterday getting out of the shower

## 2012-02-02 NOTE — Progress Notes (Signed)
Pt pain is a 7 at rest and goes up to a 10 when she moves her pelvic area or legs

## 2012-02-02 NOTE — MAU Provider Note (Signed)
History     CSN: [id number]  Arrival date and time: 02/02/12 1445   First Provider Initiated Contact with Patient 02/02/12 1530      Chief Complaint  Patient presents with  . Pelvic Pain   HPI 21 y.o. G2P1001 at [redacted]w[redacted]d with pelvic pain, constant since yesterday after slipping in shower, did not actually fall. No bleeding or LOF, no contractions, + fetal movement.    Past Medical History  Diagnosis Date  . Headache   . Anemia     Past Surgical History  Procedure Date  . Wisdom tooth extraction     Family History  Problem Relation Age of Onset  . Cancer Paternal Aunt     History  Substance Use Topics  . Smoking status: Never Smoker   . Smokeless tobacco: Not on file  . Alcohol Use: No    Allergies:  Allergies  Allergen Reactions  . Latex Itching    Prescriptions prior to admission  Medication Sig Dispense Refill  . acetaminophen (TYLENOL) 500 MG tablet Take 500 mg by mouth every 6 (six) hours as needed. For pain      . butalbital-acetaminophen-caffeine (FIORICET, ESGIC) 50-325-40 MG per tablet Take 1 tablet by mouth 2 (two) times daily as needed. For migraines      . calcium carbonate (TUMS - DOSED IN MG ELEMENTAL CALCIUM) 500 MG chewable tablet Chew 1 tablet by mouth 2 (two) times daily as needed. For heartburn      . HYDROcodone-acetaminophen (NORCO) 7.5-325 MG per tablet Take 1 tablet by mouth every 6 (six) hours as needed. For pain      . Prenatal Vit-Fe Fumarate-FA (PRENATAL MULTIVITAMIN) TABS Take 1 tablet by mouth at bedtime.        Review of Systems  Constitutional: Negative.   Respiratory: Negative.   Cardiovascular: Negative.   Gastrointestinal: Negative for nausea, vomiting, abdominal pain, diarrhea and constipation.  Genitourinary: Negative for dysuria, urgency, frequency, hematuria and flank pain.       Negative for vaginal bleeding, cramping/contractions  Musculoskeletal: Negative.   Neurological: Negative.   Psychiatric/Behavioral: Negative.     Physical Exam   Blood pressure 114/65, pulse 89, temperature 97.7 F (36.5 C), temperature source Oral, resp. rate 18.  Physical Exam  Nursing note and vitals reviewed. Constitutional: She is oriented to person, place, and time. She appears well-developed and well-nourished. No distress.  Cardiovascular: Normal rate.   Respiratory: Effort normal.  GI: Soft. There is tenderness (mild over symphysis pubis).  Genitourinary:       SVE: 1/thick/high  Musculoskeletal: Normal range of motion.  Neurological: She is alert and oriented to person, place, and time.  Skin: Skin is warm and dry.  Psychiatric: She has a normal mood and affect.    MAU Course  Procedures  NST reactive, TOCO quiet  Assessment and Plan   1. Pelvic pain in pregnancy    Medication List  As of 02/02/2012  5:24 PM   START taking these medications         COMFORT FIT MATERNITY SUPP SM Misc   1 Units by Does not apply route as needed (pregnancy related pain).         CONTINUE taking these medications         acetaminophen 500 MG tablet   Commonly known as: TYLENOL      butalbital-acetaminophen-caffeine 50-325-40 MG per tablet   Commonly known as: FIORICET, ESGIC      calcium carbonate 500 MG chewable tablet  Commonly known as: TUMS - dosed in mg elemental calcium      HYDROcodone-acetaminophen 7.5-325 MG per tablet   Commonly known as: NORCO      prenatal multivitamin Tabs          Where to get your medications    These are the prescriptions that you need to pick up.   You may get these medications from any pharmacy.         COMFORT FIT MATERNITY SUPP SM Misc           Follow-up Information    Follow up with HARPER,CHARLES A, MD. (as scheduled)    Contact information:   [address] [place] [address] [phone]         Precautions rev'd  Georges Mouse 02/02/2012, 3:33 PM

## 2012-02-02 NOTE — Progress Notes (Signed)
Pt complaining of not being able to move or stand without pain

## 2012-02-02 NOTE — Progress Notes (Signed)
Pt wheeled out the waiting room while mother went to get the car.

## 2012-02-18 ENCOUNTER — Inpatient Hospital Stay (HOSPITAL_COMMUNITY)
Admission: AD | Admit: 2012-02-18 | Discharge: 2012-02-18 | Disposition: A | Discharge status: Home / Self Care | Payer: Medicaid Other | Source: Ambulatory Visit | Attending: Obstetrics & Gynecology | Admitting: Obstetrics & Gynecology

## 2012-02-18 ENCOUNTER — Encounter (HOSPITAL_COMMUNITY): Payer: Self-pay | Admitting: *Deleted

## 2012-02-18 DIAGNOSIS — O479 False labor, unspecified: Secondary | ICD-10-CM | POA: Insufficient documentation

## 2012-02-18 LAB — URINALYSIS, ROUTINE W REFLEX MICROSCOPIC
Bilirubin Urine: NEGATIVE
Glucose, UA: NEGATIVE mg/dL
Hgb urine dipstick: NEGATIVE
Ketones, ur: 15 mg/dL — AB
pH: 6 (ref 5.0–8.0)

## 2012-02-18 MED ORDER — HYDROCODONE-ACETAMINOPHEN 5-325 MG PO TABS
1.0000 | ORAL_TABLET | Freq: Once | ORAL | Status: AC
  Administered 2012-02-18: 1 via ORAL
  Filled 2012-02-18: qty 1

## 2012-02-18 NOTE — MAU Note (Signed)
Pt reports Uc's sor the past 2 nights. Pt states that the uc's are 3-5 apart. Pt denies ROM

## 2012-02-22 ENCOUNTER — Inpatient Hospital Stay (HOSPITAL_COMMUNITY): Payer: Medicaid Other | Admitting: Anesthesiology

## 2012-02-22 ENCOUNTER — Encounter (HOSPITAL_COMMUNITY): Payer: Self-pay | Admitting: Anesthesiology

## 2012-02-22 ENCOUNTER — Encounter (HOSPITAL_COMMUNITY): Payer: Self-pay | Admitting: *Deleted

## 2012-02-22 ENCOUNTER — Inpatient Hospital Stay (HOSPITAL_COMMUNITY)
Admission: AD | Admit: 2012-02-22 | Discharge: 2012-02-25 | DRG: 775 | Disposition: A | Discharge status: Home / Self Care | Payer: Medicaid Other | Source: Ambulatory Visit | Attending: Obstetrics & Gynecology | Admitting: Obstetrics & Gynecology

## 2012-02-22 DIAGNOSIS — IMO0001 Reserved for inherently not codable concepts without codable children: Secondary | ICD-10-CM

## 2012-02-22 LAB — CBC
MCH: 28.4 pg (ref 26.0–34.0)
MCHC: 33.3 g/dL (ref 30.0–36.0)
Platelets: 131 10*3/uL — ABNORMAL LOW (ref 150–400)
RDW: 13.2 % (ref 11.5–15.5)

## 2012-02-22 MED ORDER — IBUPROFEN 600 MG PO TABS: 600.0000 mg | ORAL_TABLET | Freq: Four times a day (QID) | ORAL | Status: DC | PRN

## 2012-02-22 MED ORDER — ONDANSETRON HCL 4 MG/2ML IJ SOLN: 4.0000 mg | Freq: Four times a day (QID) | INTRAMUSCULAR | Status: DC | PRN

## 2012-02-22 MED ORDER — OXYTOCIN BOLUS FROM INFUSION
500.0000 mL | Freq: Once | INTRAVENOUS | Status: AC
  Administered 2012-02-23: 500 mL via INTRAVENOUS
  Filled 2012-02-22: qty 500

## 2012-02-22 MED ORDER — LACTATED RINGERS IV SOLN
500.0000 mL | Freq: Once | INTRAVENOUS | Status: AC
  Administered 2012-02-22: 500 mL via INTRAVENOUS

## 2012-02-22 MED ORDER — BUTORPHANOL TARTRATE 1 MG/ML IJ SOLN
2.0000 mg | Freq: Once | INTRAMUSCULAR | Status: AC
  Administered 2012-02-22: 2 mg via INTRAVENOUS
  Filled 2012-02-22: qty 2

## 2012-02-22 MED ORDER — PHENYLEPHRINE 40 MCG/ML (10ML) SYRINGE FOR IV PUSH (FOR BLOOD PRESSURE SUPPORT)
80.0000 ug | PREFILLED_SYRINGE | INTRAVENOUS | Status: DC | PRN
  Filled 2012-02-22: qty 2

## 2012-02-22 MED ORDER — DIPHENHYDRAMINE HCL 50 MG/ML IJ SOLN: 12.5000 mg | INTRAMUSCULAR | Status: DC | PRN

## 2012-02-22 MED ORDER — LIDOCAINE HCL (PF) 1 % IJ SOLN: 30.0000 mL | INTRAMUSCULAR | Status: DC | PRN

## 2012-02-22 MED ORDER — LACTATED RINGERS IV SOLN: 500.0000 mL | INTRAVENOUS | Status: DC | PRN

## 2012-02-22 MED ORDER — FLEET ENEMA 7-19 GM/118ML RE ENEM: 1.0000 | ENEMA | RECTAL | Status: DC | PRN

## 2012-02-22 MED ORDER — OXYTOCIN 40 UNITS IN LACTATED RINGERS INFUSION - SIMPLE MED
62.5000 mL/h | Freq: Once | INTRAVENOUS | Status: AC
  Administered 2012-02-23: 62.5 mL/h via INTRAVENOUS
  Filled 2012-02-22: qty 1000

## 2012-02-22 MED ORDER — OXYTOCIN 40 UNITS IN LACTATED RINGERS INFUSION - SIMPLE MED: 62.5000 mL/h | Freq: Once | INTRAVENOUS | Status: DC

## 2012-02-22 MED ORDER — PHENYLEPHRINE 40 MCG/ML (10ML) SYRINGE FOR IV PUSH (FOR BLOOD PRESSURE SUPPORT)
80.0000 ug | PREFILLED_SYRINGE | INTRAVENOUS | Status: DC | PRN
  Filled 2012-02-22: qty 5
  Filled 2012-02-22: qty 2

## 2012-02-22 MED ORDER — OXYTOCIN BOLUS FROM INFUSION
500.0000 mL | Freq: Once | INTRAVENOUS | Status: DC
  Filled 2012-02-22: qty 500

## 2012-02-22 MED ORDER — IBUPROFEN 600 MG PO TABS
600.0000 mg | ORAL_TABLET | Freq: Four times a day (QID) | ORAL | Status: DC | PRN
  Administered 2012-02-23: 600 mg via ORAL
  Filled 2012-02-22: qty 1

## 2012-02-22 MED ORDER — SODIUM BICARBONATE 8.4 % IV SOLN
INTRAVENOUS | Status: DC | PRN
  Administered 2012-02-22: 4 mL via EPIDURAL

## 2012-02-22 MED ORDER — CITRIC ACID-SODIUM CITRATE 334-500 MG/5ML PO SOLN: 30.0000 mL | ORAL | Status: DC | PRN

## 2012-02-22 MED ORDER — LACTATED RINGERS IV SOLN
INTRAVENOUS | Status: DC
  Administered 2012-02-22: 17:00:00 via INTRAVENOUS

## 2012-02-22 MED ORDER — ACETAMINOPHEN 325 MG PO TABS: 650.0000 mg | ORAL_TABLET | ORAL | Status: DC | PRN

## 2012-02-22 MED ORDER — BUTORPHANOL TARTRATE 1 MG/ML IJ SOLN
1.0000 mg | INTRAMUSCULAR | Status: DC | PRN
  Administered 2012-02-22: 1 mg via INTRAVENOUS
  Filled 2012-02-22: qty 1

## 2012-02-22 MED ORDER — OXYCODONE-ACETAMINOPHEN 5-325 MG PO TABS
1.0000 | ORAL_TABLET | ORAL | Status: DC | PRN
Start: 2012-02-22 — End: 2012-02-22

## 2012-02-22 MED ORDER — OXYCODONE-ACETAMINOPHEN 5-325 MG PO TABS: 1.0000 | ORAL_TABLET | ORAL | Status: DC | PRN

## 2012-02-22 MED ORDER — LIDOCAINE HCL (PF) 1 % IJ SOLN
30.0000 mL | INTRAMUSCULAR | Status: DC | PRN
  Administered 2012-02-23: 30 mL via SUBCUTANEOUS
  Filled 2012-02-22: qty 30

## 2012-02-22 MED ORDER — EPHEDRINE 5 MG/ML INJ
10.0000 mg | INTRAVENOUS | Status: DC | PRN
Start: 2012-02-22 — End: 2012-02-23
  Filled 2012-02-22: qty 2
  Filled 2012-02-22: qty 4

## 2012-02-22 MED ORDER — FENTANYL 2.5 MCG/ML BUPIVACAINE 1/10 % EPIDURAL INFUSION (WH - ANES)
14.0000 mL/h | INTRAMUSCULAR | Status: DC
  Administered 2012-02-22: 14 mL/h via EPIDURAL
  Filled 2012-02-22 (×2): qty 60

## 2012-02-22 MED ORDER — ONDANSETRON HCL 4 MG/2ML IJ SOLN
4.0000 mg | Freq: Four times a day (QID) | INTRAMUSCULAR | Status: DC | PRN
  Administered 2012-02-22: 4 mg via INTRAVENOUS
  Filled 2012-02-22: qty 2

## 2012-02-22 MED ORDER — LACTATED RINGERS IV SOLN
INTRAVENOUS | Status: DC
  Administered 2012-02-22 (×2): via INTRAVENOUS

## 2012-02-22 MED ORDER — FENTANYL 2.5 MCG/ML BUPIVACAINE 1/10 % EPIDURAL INFUSION (WH - ANES)
INTRAMUSCULAR | Status: DC | PRN
  Administered 2012-02-22: 14 mL/h via EPIDURAL

## 2012-02-22 MED ORDER — EPHEDRINE 5 MG/ML INJ
10.0000 mg | INTRAVENOUS | Status: DC | PRN
Start: 2012-02-22 — End: 2012-02-23
  Filled 2012-02-22: qty 2

## 2012-02-22 MED ORDER — BUTORPHANOL TARTRATE 1 MG/ML IJ SOLN: 1.0000 mg | INTRAMUSCULAR | Status: DC | PRN

## 2012-02-22 NOTE — MAU Note (Signed)
Contractions started around 0300, 'unable to time them because in so much pain'. No bleeding or leaking.

## 2012-02-22 NOTE — Anesthesia Procedure Notes (Signed)
Epidural Patient location during procedure: OB  Preanesthetic Checklist Completed: patient identified, site marked, surgical consent, pre-op evaluation, timeout performed, IV checked, risks and benefits discussed and monitors and equipment checked  Epidural Patient position: sitting Prep: site prepped and draped and DuraPrep Patient monitoring: continuous pulse ox and blood pressure Approach: midline Injection technique: LOR air  Needle:  Needle type: Tuohy  Needle gauge: 17 G Needle length: 9 cm and 9 Needle insertion depth: 6 cm Catheter type: closed end flexible Catheter size: 19 Gauge Catheter at skin depth: 12 cm Test dose: negative  Assessment Events: blood not aspirated, injection not painful, no injection resistance, negative IV test and no paresthesia  Additional Notes Dosing of Epidural:  1st dose, through needle ............................................. epi 1:200K + Xylocaine 40 mg  2nd dose, through catheter, after waiting 3 minutes.....epi 1:200K + Xylocaine 40 mg  3rd dose, through catheter after waiting 3 minutes .............................Marcaine   4mg   ( mg Marcaine are expressed as equivilent  cc's medication removed from the 0.1%Bupiv / fentanyl syringe from L&D pump)  ( 2% Xylo charted as a single dose in Epic Meds for ease of charting; actual dosing was fractionated as above, for saftey's sake)  As each dose occurred, patient was free of IV sx; and patient exhibited no evidence of SA injection.  Patient is more comfortable after epidural dosed. Please see RN's note for documentation of vital signs,and FHR which are stable.  Patient reminded not to try to ambulate with numb legs, and that an RN must be present the 1st time she attempts to get up.    

## 2012-02-22 NOTE — H&P (Signed)
Tracey Whitney is a 21 y.o. female presenting for contractions. Maternal Medical History:  Reason for admission: Reason for admission: contractions.  Contractions: Frequency: regular.   Perceived severity is strong.    Fetal activity: Perceived fetal activity is normal.    Prenatal complications: no prenatal complications   OB History    Grav Para Term Preterm Abortions TAB SAB Ect Mult Living   2 1 1       1      Past Medical History  Diagnosis Date  . Headache   . Anemia    Past Surgical History  Procedure Date  . Wisdom tooth extraction    Family History: family history includes Cancer in her paternal aunt.  There is no history of Other. Social History:  reports that she has never smoked. She does not have any smokeless tobacco history on file. She reports that she does not drink alcohol or use illicit drugs.     Review of Systems  Constitutional: Negative for fever.  Eyes: Negative for blurred vision.  Respiratory: Negative for shortness of breath.   Gastrointestinal: Negative for vomiting.  Skin: Negative for rash.  Neurological: Negative for headaches.    Dilation: 5.5 Effacement (%): 80 Station: -2 Exam by:: jackson moore Blood pressure 126/75, pulse 79, temperature 98.1 F (36.7 C), temperature source Oral, resp. rate 20, height 5\' 9"  (1.753 m), weight 92.08 kg (203 lb), SpO2 94.00%. Maternal Exam:  Uterine Assessment: Contraction frequency is regular.   Abdomen: not evaluated.  Introitus: not evaluated.   Cervix: Cervix evaluated by digital exam.     Fetal Exam Fetal Monitor Review: Variability: moderate (6-25 bpm).   Pattern: accelerations present and no decelerations.    Fetal State Assessment: Category I - tracings are normal.     Physical Exam  Constitutional: She appears well-developed.  HENT:  Head: Normocephalic.  Neck: Neck supple. No thyromegaly present.  Cardiovascular: Normal rate and regular rhythm.   Respiratory: Breath sounds  normal.  GI: Soft. Bowel sounds are normal.  Skin: No rash noted.    Prenatal labs: ABO, Rh: O/Positive/-- (07/09 0000) Antibody: Negative (07/09 0000) Rubella: Immune (07/09 0000) RPR: Nonreactive (07/09 0000)  HBsAg: Negative (07/09 0000)  HIV: Non-reactive (07/09 0000)  GBS: Negative (09/12 0000)   Assessment/Plan: Primipara @ [redacted]w[redacted]d.  Active labor.  Category I FHT.  Admit Anticipate an NSVD   JACKSON-MOORE,Marquesha Robideau A 02/22/2012, 7:39 PM

## 2012-02-22 NOTE — Anesthesia Preprocedure Evaluation (Signed)

## 2012-02-23 ENCOUNTER — Encounter (HOSPITAL_COMMUNITY): Payer: Self-pay | Admitting: *Deleted

## 2012-02-23 LAB — ABO/RH: ABO/RH(D): O POS

## 2012-02-23 MED ORDER — SENNOSIDES-DOCUSATE SODIUM 8.6-50 MG PO TABS
2.0000 | ORAL_TABLET | Freq: Every day | ORAL | Status: DC
  Administered 2012-02-23 – 2012-02-24 (×2): 2 via ORAL

## 2012-02-23 MED ORDER — DIPHENHYDRAMINE HCL 25 MG PO CAPS: 25.0000 mg | ORAL_CAPSULE | Freq: Four times a day (QID) | ORAL | Status: DC | PRN

## 2012-02-23 MED ORDER — MEASLES, MUMPS & RUBELLA VAC ~~LOC~~ INJ: 0.5000 mL | INJECTION | Freq: Once | SUBCUTANEOUS | Status: DC

## 2012-02-23 MED ORDER — TETANUS-DIPHTH-ACELL PERTUSSIS 5-2.5-18.5 LF-MCG/0.5 IM SUSP
0.5000 mL | Freq: Once | INTRAMUSCULAR | Status: AC
  Administered 2012-02-24: 0.5 mL via INTRAMUSCULAR

## 2012-02-23 MED ORDER — MAGNESIUM HYDROXIDE 400 MG/5ML PO SUSP: 30.0000 mL | ORAL | Status: DC | PRN

## 2012-02-23 MED ORDER — OXYCODONE-ACETAMINOPHEN 5-325 MG PO TABS
1.0000 | ORAL_TABLET | ORAL | Status: DC | PRN
  Administered 2012-02-23: 2 via ORAL
  Administered 2012-02-23 – 2012-02-25 (×7): 1 via ORAL
  Filled 2012-02-23 (×2): qty 1
  Filled 2012-02-23: qty 2
  Filled 2012-02-23 (×5): qty 1

## 2012-02-23 MED ORDER — ZOLPIDEM TARTRATE 5 MG PO TABS: 5.0000 mg | ORAL_TABLET | Freq: Every evening | ORAL | Status: DC | PRN

## 2012-02-23 MED ORDER — ONDANSETRON HCL 4 MG PO TABS
4.0000 mg | ORAL_TABLET | ORAL | Status: DC | PRN
  Administered 2012-02-24: 4 mg via ORAL
  Filled 2012-02-23: qty 1

## 2012-02-23 MED ORDER — IBUPROFEN 600 MG PO TABS
600.0000 mg | ORAL_TABLET | Freq: Four times a day (QID) | ORAL | Status: DC
  Administered 2012-02-23 – 2012-02-25 (×8): 600 mg via ORAL
  Filled 2012-02-23 (×8): qty 1

## 2012-02-23 MED ORDER — WITCH HAZEL-GLYCERIN EX PADS: 1.0000 "application " | MEDICATED_PAD | CUTANEOUS | Status: DC | PRN

## 2012-02-23 MED ORDER — PRENATAL MULTIVITAMIN CH
1.0000 | ORAL_TABLET | Freq: Every day | ORAL | Status: DC
  Administered 2012-02-23 – 2012-02-25 (×3): 1 via ORAL
  Filled 2012-02-23 (×3): qty 1

## 2012-02-23 MED ORDER — BENZOCAINE-MENTHOL 20-0.5 % EX AERO
1.0000 "application " | INHALATION_SPRAY | CUTANEOUS | Status: DC | PRN
  Filled 2012-02-23: qty 56

## 2012-02-23 MED ORDER — ONDANSETRON HCL 4 MG/2ML IJ SOLN: 4.0000 mg | INTRAMUSCULAR | Status: DC | PRN

## 2012-02-23 MED ORDER — LANOLIN HYDROUS EX OINT: TOPICAL_OINTMENT | CUTANEOUS | Status: DC | PRN

## 2012-02-23 MED ORDER — DIBUCAINE 1 % RE OINT: 1.0000 "application " | TOPICAL_OINTMENT | RECTAL | Status: DC | PRN

## 2012-02-23 MED ORDER — FERROUS SULFATE 325 (65 FE) MG PO TABS
325.0000 mg | ORAL_TABLET | Freq: Two times a day (BID) | ORAL | Status: DC
  Administered 2012-02-23 – 2012-02-25 (×5): 325 mg via ORAL
  Filled 2012-02-23 (×5): qty 1

## 2012-02-23 NOTE — Progress Notes (Signed)
Post Partum Day 0 Subjective: no complaints  Objective: Blood pressure 120/77, pulse 80, temperature 98.5 F (36.9 C), temperature source Oral, resp. rate 18, height 5\' 9"  (1.753 m), weight 92.08 kg (203 lb), SpO2 96.00%, unknown if currently breastfeeding.  Physical Exam:  General: alert and no distress Lochia: appropriate Uterine Fundus: firm Incision: none DVT Evaluation: No evidence of DVT seen on physical exam.   Basename 02/22/12 1640  HGB 12.2  HCT 36.6    Assessment/Plan: Plan for discharge tomorrow   LOS: 1 day   HARPER,CHARLES A 02/23/2012, 8:20 AM

## 2012-02-23 NOTE — Progress Notes (Signed)
UR chart review completed.  

## 2012-02-23 NOTE — Anesthesia Postprocedure Evaluation (Signed)
  Anesthesia Post-op Note  Patient: Tracey Whitney  Procedure(s) Performed: * No procedures listed *  Patient Location: Mother/Baby  Anesthesia Type: Epidural  Level of Consciousness: awake  Airway and Oxygen Therapy: Patient Spontanous Breathing  Post-op Pain: none  Post-op Assessment: Patient's Cardiovascular Status Stable, Respiratory Function Stable, Patent Airway, No signs of Nausea or vomiting, Adequate PO intake, Pain level controlled, No headache, No backache, No residual numbness and No residual motor weakness  Post-op Vital Signs: Reviewed and stable  Complications: No apparent anesthesia complications

## 2012-02-24 MED ORDER — OXYCODONE-ACETAMINOPHEN 5-325 MG PO TABS: 1.0000 | ORAL_TABLET | ORAL | Status: DC | PRN

## 2012-02-24 MED ORDER — IBUPROFEN 600 MG PO TABS: 600.0000 mg | ORAL_TABLET | Freq: Four times a day (QID) | ORAL | Status: DC

## 2012-02-24 NOTE — Progress Notes (Signed)
Post Partum Day 1 Subjective: no complaints  Objective: Blood pressure 112/73, pulse 70, temperature 97.5 F (36.4 C), temperature source Oral, resp. rate 18, height 5\' 9"  (1.753 m), weight 92.08 kg (203 lb), SpO2 100.00%, unknown if currently breastfeeding.  Physical Exam:  General: alert and no distress Lochia: appropriate Uterine Fundus: firm Incision: healing well DVT Evaluation: No evidence of DVT seen on physical exam.   Basename 02/22/12 1640  HGB 12.2  HCT 36.6    Assessment/Plan: Discharge home   LOS: 2 days   Saraiyah Hemminger A 02/24/2012, 10:55 AM

## 2012-02-24 NOTE — Discharge Summary (Signed)
Obstetric Discharge Summary Reason for Admission: onset of labor Prenatal Procedures: ultrasound Intrapartum Procedures: spontaneous vaginal delivery Postpartum Procedures: none Complications-Operative and Postpartum: none HGB  Date Value Range Status  05/19/2011 12.0  11.6 - 15.9 g/dL Final     Hemoglobin  Date Value Range Status  02/22/2012 12.2  12.0 - 15.0 g/dL Final     HCT  Date Value Range Status  02/22/2012 36.6  36.0 - 46.0 % Final  05/19/2011 35.5  34.8 - 46.6 % Final    Physical Exam:  General: alert and no distress Lochia: appropriate Uterine Fundus: firm Incision: healing well DVT Evaluation: No evidence of DVT seen on physical exam.  Discharge Diagnoses: Term Pregnancy-delivered  Discharge Information: Date: 02/24/2012 Activity: pelvic rest Diet: routine Medications: PNV, Ibuprofen, Colace and Percocet Condition: stable Instructions: refer to practice specific booklet Discharge to: home Follow-up Information    Follow up with Antionette Char A, MD. Schedule an appointment as soon as possible for a visit in 6 weeks.   Contact information:   [address], [address] [place] [address] [phone]          Newborn Data: Live born female  Birth Weight: 9 lb 1.2 oz (4116 g) APGAR: 6, 8  Home with mother.  HARPER,CHARLES A 02/24/2012, 11:01 AM

## 2012-02-24 NOTE — Progress Notes (Signed)
Pt decided to stay one more night. Dr. Clearance Coots notified. Telephone order given to d/c discharge order.

## 2012-02-25 NOTE — Progress Notes (Signed)
Post Partum Day 2 Subjective: no complaints  Objective: Blood pressure 113/72, pulse 69, temperature 98.4 F (36.9 C), temperature source Oral, resp. rate 18, height 5\' 9"  (1.753 m), weight 92.08 kg (203 lb), SpO2 100.00%, unknown if currently breastfeeding.  Physical Exam:  General: alert and no distress Lochia: appropriate Uterine Fundus: firm Incision: healing well DVT Evaluation: No evidence of DVT seen on physical exam.   Basename 02/22/12 1640  HGB 12.2  HCT 36.6    Assessment/Plan: Discharge home   LOS: 3 days   HARPER,CHARLES A 02/25/2012, 4:57 AM

## 2012-09-02 ENCOUNTER — Inpatient Hospital Stay (HOSPITAL_COMMUNITY)
Admission: AD | Admit: 2012-09-02 | Discharge: 2012-09-02 | Disposition: A | Discharge status: Home / Self Care | Payer: Medicaid Other | Source: Ambulatory Visit | Attending: Obstetrics & Gynecology | Admitting: Obstetrics & Gynecology

## 2012-09-02 ENCOUNTER — Encounter (HOSPITAL_COMMUNITY): Payer: Self-pay | Admitting: *Deleted

## 2012-09-02 DIAGNOSIS — R102 Pelvic and perineal pain: Secondary | ICD-10-CM

## 2012-09-02 DIAGNOSIS — R109 Unspecified abdominal pain: Secondary | ICD-10-CM | POA: Insufficient documentation

## 2012-09-02 DIAGNOSIS — N949 Unspecified condition associated with female genital organs and menstrual cycle: Secondary | ICD-10-CM

## 2012-09-02 LAB — URINALYSIS, ROUTINE W REFLEX MICROSCOPIC
Bilirubin Urine: NEGATIVE
Ketones, ur: NEGATIVE mg/dL
Leukocytes, UA: NEGATIVE
Nitrite: NEGATIVE
Urobilinogen, UA: 0.2 mg/dL (ref 0.0–1.0)
pH: 6 (ref 5.0–8.0)

## 2012-09-02 LAB — POCT PREGNANCY, URINE: Preg Test, Ur: NEGATIVE

## 2012-09-02 LAB — WET PREP, GENITAL: Clue Cells Wet Prep HPF POC: NONE SEEN

## 2012-09-02 MED ORDER — KETOROLAC TROMETHAMINE 60 MG/2ML IM SOLN
60.0000 mg | Freq: Once | INTRAMUSCULAR | Status: AC
  Administered 2012-09-02: 60 mg via INTRAMUSCULAR
  Filled 2012-09-02: qty 2

## 2012-09-02 NOTE — MAU Provider Note (Signed)
History     CSN: [id number]  Arrival date and time: 09/02/12 0100   None     Chief Complaint  Patient presents with  . Abdominal Pain   HPI Pt here with report of vaginal bleeding since IUD placed in December.  Placed on OCPs to control bleeding.  Reports pelvic pain, "contraction" like pain that started two days ago.  Bleeding moderate amount.     Past Medical History  Diagnosis Date  . Headache   . Anemia     Past Surgical History  Procedure Laterality Date  . Wisdom tooth extraction      Family History  Problem Relation Age of Onset  . Cancer Paternal Aunt   . Other Neg Hx     History  Substance Use Topics  . Smoking status: Never Smoker   . Smokeless tobacco: Not on file  . Alcohol Use: No    Allergies:  Allergies  Allergen Reactions  . Latex Itching    Prescriptions prior to admission  Medication Sig Dispense Refill  . ibuprofen (ADVIL,MOTRIN) 600 MG tablet Take 1 tablet (600 mg total) by mouth every 6 (six) hours.  30 tablet  5  . Prenatal Vit-Fe Fumarate-FA (PRENATAL MULTIVITAMIN) TABS Take 1 tablet by mouth at bedtime.      . butalbital-acetaminophen-caffeine (FIORICET, ESGIC) 50-325-40 MG per tablet Take 1 tablet by mouth 2 (two) times daily as needed. For migraines      . oxyCODONE-acetaminophen (PERCOCET/ROXICET) 5-325 MG per tablet Take 1-2 tablets by mouth every 3 (three) hours as needed for pain (moderate - severe pain).  40 tablet  0    Review of Systems  Gastrointestinal: Positive for nausea and abdominal pain. Negative for vomiting.  Genitourinary:       Vaginal bleeding  Neurological: Positive for dizziness.  All other systems reviewed and are negative.   Physical Exam   Blood pressure 131/66, pulse 80, temperature 97.7 F (36.5 C), temperature source Oral, resp. rate 20, height 5\' 9"  (1.753 m), weight 188 lb (85.276 kg), SpO2 100.00%.  Physical Exam  Constitutional: She is oriented to person, place, and time. She appears  well-developed and well-nourished. No distress.  HENT:  Head: Normocephalic.  Eyes: Pupils are equal, round, and reactive to light.  Neck: Normal range of motion. Neck supple.  Cardiovascular: Normal rate, regular rhythm and normal heart sounds.   Respiratory: Effort normal and breath sounds normal. No respiratory distress.  GI: Soft. She exhibits no mass. There is tenderness (general lower pelvis). There is no guarding.  Genitourinary: No bleeding around the vagina. No vaginal discharge found.  Negative cervical motion tenderness; IUD strings seen, apparatus not palpated in cervical os  Neurological: She is alert and oriented to person, place, and time. She has normal reflexes.  Skin: Skin is warm and dry.    MAU Course  Procedures  Results for orders placed during the hospital encounter of 09/02/12 (from the past 24 hour(s))  URINALYSIS, ROUTINE W REFLEX MICROSCOPIC     Status: None   Collection Time    09/02/12  1:20 AM      Result Value Range   Color, Urine YELLOW  YELLOW   APPearance CLEAR  CLEAR   Specific Gravity, Urine 1.020  1.005 - 1.030   pH 6.0  5.0 - 8.0   Glucose, UA NEGATIVE  NEGATIVE mg/dL   Hgb urine dipstick NEGATIVE  NEGATIVE   Bilirubin Urine NEGATIVE  NEGATIVE   Ketones, ur NEGATIVE  NEGATIVE mg/dL  Protein, ur NEGATIVE  NEGATIVE mg/dL   Urobilinogen, UA 0.2  0.0 - 1.0 mg/dL   Nitrite NEGATIVE  NEGATIVE   Leukocytes, UA NEGATIVE  NEGATIVE  WET PREP, GENITAL     Status: Abnormal   Collection Time    09/02/12  1:37 AM      Result Value Range   Yeast Wet Prep HPF POC NONE SEEN  NONE SEEN   Trich, Wet Prep NONE SEEN  NONE SEEN   Clue Cells Wet Prep HPF POC NONE SEEN  NONE SEEN   WBC, Wet Prep HPF POC FEW (*) NONE SEEN    Toradol 60mg  IM > pt reports relief.  Assessment and Plan  Acute Pelvic Pain  Plan: DC to home Will call [facility] on Monday for follow-up appointment.  [facility] 09/02/2012, 1:31 AM

## 2012-09-02 NOTE — MAU Note (Signed)
Had baby in Sept and IUD placed in Dec. Have been bleeding since then and MD would not remove and gave me BCPs to help with bleeding. Having abdominal pain and bleeding which did stop on Thurs after bleeding for 2+wks.

## 2012-09-02 NOTE — Progress Notes (Signed)
W. Muhammed CNM in to discuss lab results and d/c plan. Written and verbal d/c instructions given and understanding voiced. 

## 2012-09-03 LAB — GC/CHLAMYDIA PROBE AMP: GC Probe RNA: NEGATIVE

## 2012-09-04 ENCOUNTER — Telehealth: Payer: Self-pay | Admitting: *Deleted

## 2012-09-04 NOTE — Telephone Encounter (Signed)
Pt states she had an IUD inserted in December. Pt states she is still Bleeding and having back pain. Pt states she is also experiencing headaches, nausea and weakness. Pt is requesting an appointment. Pt is wanting to have her IUD removed. Pt scheduled an appointment for 09-05-12.

## 2012-09-05 ENCOUNTER — Ambulatory Visit: Payer: Self-pay | Admitting: Obstetrics

## 2012-09-06 ENCOUNTER — Ambulatory Visit (INDEPENDENT_AMBULATORY_CARE_PROVIDER_SITE_OTHER): Payer: Medicaid Other | Admitting: Obstetrics

## 2012-09-06 VITALS — BP 103/74 | HR 84 | Temp 98.1°F | Ht 69.0 in | Wt 188.0 lb

## 2012-09-06 DIAGNOSIS — Z30432 Encounter for removal of intrauterine contraceptive device: Secondary | ICD-10-CM

## 2012-09-06 DIAGNOSIS — Z113 Encounter for screening for infections with a predominantly sexual mode of transmission: Secondary | ICD-10-CM

## 2012-09-06 DIAGNOSIS — N760 Acute vaginitis: Secondary | ICD-10-CM

## 2012-09-06 DIAGNOSIS — Z30431 Encounter for routine checking of intrauterine contraceptive device: Secondary | ICD-10-CM

## 2012-09-06 DIAGNOSIS — D649 Anemia, unspecified: Secondary | ICD-10-CM

## 2012-09-06 DIAGNOSIS — Z3009 Encounter for other general counseling and advice on contraception: Secondary | ICD-10-CM

## 2012-09-06 DIAGNOSIS — N920 Excessive and frequent menstruation with regular cycle: Secondary | ICD-10-CM

## 2012-09-06 MED ORDER — NORETHINDRONE 0.35 MG PO TABS: 1.0000 | ORAL_TABLET | Freq: Every day | ORAL | Status: DC

## 2012-09-06 MED ORDER — FUSION PLUS PO CAPS: 1.0000 | ORAL_CAPSULE | Freq: Every day | ORAL | Status: DC

## 2012-09-06 NOTE — Progress Notes (Signed)
.   Subjective:     Tracey Whitney is a 22 y.o. female here to have her Mirena removed.  She had it inserted 04/2012.  Current complaints: pelvic, back and chest pain and headaches.  She sometimes gets dizzy, fatigue and nauseous. She would like to try taking birth control pills.  Personal health questionnaire reviewed: yes.   Gynecologic History No LMP recorded. Patient is not currently having periods (Reason: IUD). Contraception: IUD Last Pap: 08/2011. Results were: normal Last mammogram: N/A. Results were:   Obstetric History OB History   Grav Para Term Preterm Abortions TAB SAB Ect Mult Living   2 2 2       2      # Outc Date GA Lbr Len/2nd Wgt Sex Del Anes PTL Lv   1 TRM 2/11 [redacted]w[redacted]d  8lb7oz(3.827kg) F SVD EPI  Yes   2 TRM 9/13 [redacted]w[redacted]d 20:31 / 01:27 9lb1.2oz(4.116kg) F SVD Local  Yes       The following portions of the patient's history were reviewed and updated as appropriate: allergies, current medications, past family history, past medical history, past social history, past surgical history and problem list.  Review of Systems Pertinent items are noted in HPI.    Objective:    General appearance: alert and no distress Abdomen: normal findings: soft, non-tender Pelvic: cervix normal in appearance, external genitalia normal, no adnexal masses or tenderness, no cervical motion tenderness, rectovaginal septum normal, uterus normal size, shape, and consistency and vagina normal without discharge    IUD removed intact.  Assessment:    Healthy female exam.    Plan:    Education reviewed: Contraceptive choices.Marland Kitchen

## 2012-09-07 LAB — WET PREP BY MOLECULAR PROBE
Candida species: NEGATIVE
Gardnerella vaginalis: NEGATIVE
Trichomonas vaginosis: NEGATIVE

## 2012-09-07 LAB — GC/CHLAMYDIA PROBE AMP: CT Probe RNA: NEGATIVE

## 2012-09-07 NOTE — Patient Instructions (Signed)
Ob/Gyn Hospitalist Postpartum visit     Interpreter: Not needed    Chief Complaint   Patient presents with    Postpartum Follow-up       Subjective:     Tracey Whitney is a 22 y.o. G2P1011 who presents for 6 wks postpartum visit    Pt reports doing well without problems.     Taking motrin/tylenol with food PRN:  no  Normal bowel and bladder function:  yes  Vaginal bleeding: none now  Feeding: Breastpumps and bottle feeding  Mood: well and happy  Sexually active since delivery: no  Support at home:  yes  Contraceptive Plans: Levonorgestrel IUD    Discussed various contraceptive measures including barrier methods (condoms), oral hormonal contraceptives (OCPs), injection methods (Depo Provera), transdermal methods (patch), intrauterine devices (Mirena/Paraguard), and implantable methods (Nexplanon). Patient opted for Mirena.  Will schedule for insertion.      Objective:     Vitals:    07/18/22 1554   BP: 109/65       - GENERAL APPEARANCE: alert, well appearing, in no apparent distress  - BREASTS: no masses noted, no significant tenderness, no palpable axillary nodes, no skin changes  - ABDOMEN POSTPARTUM: benign non-tender, without masses or organomegaly palpable  - EXTREMITIES: no redness or tenderness in the calves or thighs, no edema  - VAGINA POSTPARTUM: normal, well-healed, physiologic discharge, without lesions  - UTERUS POSTPARTUM: normal size, well involuted, firm, non-tender      Last pap: Under age    Assessment/Plan:     22 y.o. G2P1011 for postpartum visit after spontaneous vaginal delivery     Review the Delivery Report for details.      Antepartum complications: intrauterine growth restriction  Delivery complications: none  Postpartum course: uncomplicated    - Normal postpartum exam.    - Discussed that she can resume normal activity, including sexual activity, as desired    - EPDS = 3. Patient offered resources for postpartum  depression, discussed signs and symptoms and encouraged her to call clinic if any of these develop    - Patient encouraged to establish care with a PCP for continued medical care     Comfort Avovabey, CNM  [facility] CNM Hospitalist  [facility]

## 2012-09-12 ENCOUNTER — Emergency Department (HOSPITAL_COMMUNITY): Payer: Medicaid Other

## 2012-09-12 ENCOUNTER — Emergency Department (HOSPITAL_COMMUNITY)
Admission: EM | Admit: 2012-09-12 | Discharge: 2012-09-12 | Disposition: A | Discharge status: Home / Self Care | Payer: Medicaid Other | Attending: Emergency Medicine | Admitting: Emergency Medicine

## 2012-09-12 ENCOUNTER — Encounter (HOSPITAL_COMMUNITY): Payer: Self-pay

## 2012-09-12 DIAGNOSIS — W19XXXA Unspecified fall, initial encounter: Secondary | ICD-10-CM

## 2012-09-12 DIAGNOSIS — Z3202 Encounter for pregnancy test, result negative: Secondary | ICD-10-CM | POA: Insufficient documentation

## 2012-09-12 DIAGNOSIS — Y939 Activity, unspecified: Secondary | ICD-10-CM | POA: Insufficient documentation

## 2012-09-12 DIAGNOSIS — S20229A Contusion of unspecified back wall of thorax, initial encounter: Secondary | ICD-10-CM | POA: Insufficient documentation

## 2012-09-12 DIAGNOSIS — Y929 Unspecified place or not applicable: Secondary | ICD-10-CM | POA: Insufficient documentation

## 2012-09-12 DIAGNOSIS — Z79899 Other long term (current) drug therapy: Secondary | ICD-10-CM | POA: Insufficient documentation

## 2012-09-12 DIAGNOSIS — S300XXA Contusion of lower back and pelvis, initial encounter: Secondary | ICD-10-CM

## 2012-09-12 DIAGNOSIS — Z862 Personal history of diseases of the blood and blood-forming organs and certain disorders involving the immune mechanism: Secondary | ICD-10-CM | POA: Insufficient documentation

## 2012-09-12 DIAGNOSIS — W010XXA Fall on same level from slipping, tripping and stumbling without subsequent striking against object, initial encounter: Secondary | ICD-10-CM | POA: Insufficient documentation

## 2012-09-12 MED ORDER — HYDROCODONE-ACETAMINOPHEN 5-325 MG PO TABS
1.0000 | ORAL_TABLET | Freq: Once | ORAL | Status: AC
  Administered 2012-09-12: 1 via ORAL
  Filled 2012-09-12: qty 1

## 2012-09-12 MED ORDER — CYCLOBENZAPRINE HCL 10 MG PO TABS: 10.0000 mg | ORAL_TABLET | Freq: Three times a day (TID) | ORAL | Status: DC | PRN

## 2012-09-12 MED ORDER — HYDROCODONE-ACETAMINOPHEN 5-325 MG PO TABS: ORAL_TABLET | ORAL | Status: DC

## 2012-09-12 NOTE — ED Notes (Signed)
Pt reports she slipped and fell and hit buttocks.  C/O pain in buttocks, radiating into back and into both arms.

## 2012-09-12 NOTE — ED Provider Notes (Signed)
History     CSN: [id number]  Arrival date & time 09/12/12  4098   First MD Initiated Contact with Patient 09/12/12 1845      Chief Complaint  Patient presents with  . Back Pain    (Consider location/radiation/quality/duration/timing/severity/associated sxs/prior treatment) HPI Comments: Patient c/o pain to her lower back and buttocks after she slipped and fell , landing on her buttocks and her elbows.  She states she developed a sharp , shooting type pain that began in her lower back and radiated up toward her mid back.  She states the pain in her upper back later improved, but her lower back and buttocks have continued to hurt with movement, sitting or weight bearing.  She denies head injury, LOC, neck pain, headaches, dysuria, incontinence of bladder or bowel, saddle anesthesia's or numbness of the LE's.    Patient is a 22 y.o. female presenting with back pain. The history is provided by the patient.  Back Pain Location:  Lumbar spine and gluteal region Quality:  Aching and stabbing Radiates to: mid back. Pain severity:  Moderate Pain is:  Same all the time Onset quality:  Sudden Duration:  2 hours Timing:  Constant Progression:  Unchanged Chronicity:  New Context: falling   Context: not lifting heavy objects and not twisting   Relieved by:  Being still Worsened by:  Ambulation, bending, twisting, standing, movement and sitting Ineffective treatments:  None tried Associated symptoms: no abdominal pain, no abdominal swelling, no bladder incontinence, no chest pain, no dysuria, no fever, no headaches, no leg pain, no numbness, no paresthesias, no pelvic pain, no perianal numbness, no tingling and no weakness     Past Medical History  Diagnosis Date  . Headache   . Anemia     Past Surgical History  Procedure Laterality Date  . Wisdom tooth extraction      Family History  Problem Relation Age of Onset  . Cancer Paternal Aunt   . Other Neg Hx     History  Substance  Use Topics  . Smoking status: Never Smoker   . Smokeless tobacco: Not on file  . Alcohol Use: No    OB History   Grav Para Term Preterm Abortions TAB SAB Ect Mult Living   2 2 2       2       Review of Systems  Constitutional: Negative for fever.  HENT: Negative for neck pain and neck stiffness.   Respiratory: Negative for shortness of breath.   Cardiovascular: Negative for chest pain.  Gastrointestinal: Negative for vomiting, abdominal pain and constipation.  Genitourinary: Negative for bladder incontinence, dysuria, hematuria, flank pain, decreased urine volume, difficulty urinating and pelvic pain.       No perineal numbness or incontinence of urine or feces  Musculoskeletal: Positive for back pain. Negative for joint swelling and gait problem.  Skin: Negative for rash.  Neurological: Negative for dizziness, tingling, weakness, numbness, headaches and paresthesias.  All other systems reviewed and are negative.    Allergies  Latex  Home Medications   Current Outpatient Rx  Name  Route  Sig  Dispense  Refill  . ibuprofen (ADVIL,MOTRIN) 600 MG tablet   Oral   Take 1 tablet (600 mg total) by mouth every 6 (six) hours.   30 tablet   5   . Iron-FA-B Cmp-C-Biot-Probiotic (FUSION PLUS) CAPS   Oral   Take 1 capsule by mouth daily before breakfast.   30 capsule   11   .  norethindrone (MICRONOR,CAMILA,ERRIN) 0.35 MG tablet   Oral   Take 1 tablet (0.35 mg total) by mouth daily.   1 Package   11   . Prenatal Vit-Fe Fumarate-FA (PRENATAL MULTIVITAMIN) TABS   Oral   Take 1 tablet by mouth at bedtime.           BP 107/65  Pulse 70  Temp(Src) 98.3 F (36.8 C) (Oral)  Resp 18  SpO2 99%  LMP 09/05/2012  Breastfeeding? Yes  Physical Exam  Nursing note and vitals reviewed. Constitutional: She is oriented to person, place, and time. She appears well-developed and well-nourished. No distress.  HENT:  Head: Normocephalic and atraumatic.  Eyes: EOM are normal.  Pupils are equal, round, and reactive to light.  Neck: Normal range of motion. Neck supple.  Cardiovascular: Normal rate, regular rhythm, normal heart sounds and intact distal pulses.   No murmur heard. Pulmonary/Chest: Effort normal and breath sounds normal. No respiratory distress. She exhibits no tenderness.  Musculoskeletal: She exhibits tenderness. She exhibits no edema.       Right elbow: She exhibits normal range of motion, no swelling, no effusion, no deformity and no laceration. Tenderness found. Olecranon process tenderness noted.       Left elbow: She exhibits normal range of motion, no swelling, no effusion, no deformity and no laceration. Tenderness found. Olecranon process tenderness noted.       Lumbar back: She exhibits tenderness and pain. She exhibits normal range of motion, no swelling, no deformity, no laceration and normal pulse.       Back:       Arms: ttp of the lumbar spine and paraspinal muscles.  Dp pulses are brisk and symmetrical, distal sensation intact.  SLR is negative bilaterally.  Patient also describes pain to her bilateral elbows w/o edema, bruising erythema, pt has full ROM of the wrists and elbows, radial pulses brisk bilaterally, grip strength equal, no sensory or neuro deficits of the upper or lower extremities.  Neurological: She is alert and oriented to person, place, and time. No cranial nerve deficit or sensory deficit. She exhibits normal muscle tone. Coordination and gait normal.  Reflex Scores:      Tricep reflexes are 2+ on the right side and 2+ on the left side.      Bicep reflexes are 2+ on the right side and 2+ on the left side.      Patellar reflexes are 2+ on the right side and 2+ on the left side.      Achilles reflexes are 2+ on the right side and 2+ on the left side. Skin: Skin is warm and dry.    ED Course  Procedures (including critical care time)  Results for orders placed during the hospital encounter of 09/12/12  POCT PREGNANCY,  URINE      Result Value Range   Preg Test, Ur NEGATIVE  NEGATIVE    Dg Lumbar Spine Complete  09/12/2012  *RADIOLOGY REPORT*  Clinical Data: Slipped and fell.  Low back pain.  LUMBAR SPINE - COMPLETE 4+ VIEW  Comparison: None.  Findings: No evidence for fracture.  No subluxation. Intervertebral disc spaces are preserved throughout.  The facets are well-aligned bilaterally. SI joints are normal.  IMPRESSION: No acute findings.   Original Report Authenticated By: Kennith Center, M.D.         MDM  Patient has ttp of the lumbar spine and paraspinal muscles.  No focal neuro deficits on exam.  Ambulates with a steady gait.  Doubt emergent neurological or infectious process. ttp of the bilateral elbows w/o abrasions, edema.  She has full ROM of the elbows. Imaging of the elbows not indicated based on physical exam findings.    Patient is well appearing, vitals stable.  She is currently breastfeeding, but agrees to give her child formula while taking the medications prescribed.     prescribed flexeril and norco #15  The patient appears reasonably screened and/or stabilized for discharge and I doubt any other medical condition or other [facility] requiring further screening, evaluation, or treatment in the ED at this time prior to discharge.     Mattheus Rauls L. Xitlaly Ault, PA-C 09/13/12 0006

## 2012-09-14 NOTE — ED Provider Notes (Signed)
Medical screening examination/treatment/procedure(s) were performed by non-physician practitioner and as supervising physician I was immediately available for consultation/collaboration.   Hersh Minney L Pavneet Markwood, MD 09/14/12 1243 

## 2012-10-04 ENCOUNTER — Encounter (HOSPITAL_COMMUNITY): Payer: Self-pay | Admitting: *Deleted

## 2012-10-04 ENCOUNTER — Inpatient Hospital Stay (HOSPITAL_COMMUNITY)
Admission: AD | Admit: 2012-10-04 | Discharge: 2012-10-04 | Disposition: A | Discharge status: Home / Self Care | Payer: 59 | Source: Ambulatory Visit | Attending: Obstetrics | Admitting: Obstetrics

## 2012-10-04 DIAGNOSIS — L293 Anogenital pruritus, unspecified: Secondary | ICD-10-CM | POA: Insufficient documentation

## 2012-10-04 DIAGNOSIS — B379 Candidiasis, unspecified: Secondary | ICD-10-CM

## 2012-10-04 DIAGNOSIS — N949 Unspecified condition associated with female genital organs and menstrual cycle: Secondary | ICD-10-CM | POA: Insufficient documentation

## 2012-10-04 DIAGNOSIS — B49 Unspecified mycosis: Secondary | ICD-10-CM

## 2012-10-04 DIAGNOSIS — D649 Anemia, unspecified: Secondary | ICD-10-CM | POA: Insufficient documentation

## 2012-10-04 DIAGNOSIS — N920 Excessive and frequent menstruation with regular cycle: Secondary | ICD-10-CM | POA: Insufficient documentation

## 2012-10-04 LAB — URINALYSIS, ROUTINE W REFLEX MICROSCOPIC
Bilirubin Urine: NEGATIVE
Glucose, UA: NEGATIVE mg/dL
Hgb urine dipstick: NEGATIVE
Ketones, ur: NEGATIVE mg/dL
Leukocytes, UA: NEGATIVE
Protein, ur: NEGATIVE mg/dL
pH: 6 (ref 5.0–8.0)

## 2012-10-04 MED ORDER — FLUCONAZOLE 150 MG PO TABS
150.0000 mg | ORAL_TABLET | Freq: Once | ORAL | Status: AC
  Administered 2012-10-04: 150 mg via ORAL
  Filled 2012-10-04: qty 1

## 2012-10-04 NOTE — MAU Provider Note (Signed)
First contact with provider at 2300    Chief Complaint:    Tracey Whitney is  22 y.o. [id number].  Patient's last menstrual period was 09/05/2012.Marland Kitchen   She states that she began having thick clumpy vaginal discharge with itch 4 days ago.  She took one day of a 7 day vaginal treatment (4 days ago).  She is a patient of [facility]. Pt states that she came to the emergency room tonight because it was "gross".  It is not clear as to why she only took one day of a 7 day treatment.    Past Medical History  Diagnosis Date  . Headache   . Anemia     Past Surgical History  Procedure Laterality Date  . Wisdom tooth extraction      Family History  Problem Relation Age of Onset  . Cancer Paternal Aunt   . Other Neg Hx     History  Substance Use Topics  . Smoking status: Never Smoker   . Smokeless tobacco: Not on file  . Alcohol Use: No    Allergies:  Allergies  Allergen Reactions  . Latex Itching    Prescriptions prior to admission  Medication Sig Dispense Refill  . cyclobenzaprine (FLEXERIL) 10 MG tablet Take 1 tablet (10 mg total) by mouth 3 (three) times daily as needed.  21 tablet  0  . HYDROcodone-acetaminophen (NORCO/VICODIN) 5-325 MG per tablet Take one-two tabs po q 4-6 hrs prn pain  15 tablet  0  . ibuprofen (ADVIL,MOTRIN) 600 MG tablet Take 1 tablet (600 mg total) by mouth every 6 (six) hours.  30 tablet  5  . Iron-FA-B Cmp-C-Biot-Probiotic (FUSION PLUS) CAPS Take 1 capsule by mouth daily before breakfast.  30 capsule  11  . norethindrone (MICRONOR,CAMILA,ERRIN) 0.35 MG tablet Take 1 tablet (0.35 mg total) by mouth daily.  1 Package  11  . Prenatal Vit-Fe Fumarate-FA (PRENATAL MULTIVITAMIN) TABS Take 1 tablet by mouth at bedtime.         Review of Systems   Constitutional: Negative for fever and chills Eyes: Negative for visual disturbances Respiratory: Negative for shortness of breath, dyspnea Cardiovascular: Negative for chest pain or palpitations  Gastrointestinal:  Negative for vomiting, diarrhea and constipation Genitourinary: Negative for dysuria and urgency; Positive for vaginal discharge and vaginal itching Musculoskeletal: Negative for back pain, joint pain, myalgias  Neurological: Negative for dizziness and headaches    Physical Exam   Blood pressure 118/68, pulse 83, temperature 98.3 F (36.8 C), temperature source Oral, resp. rate 20, height 5\' 8"  (1.727 m), weight 85.39 kg (188 lb 4 oz), last menstrual period 09/05/2012, currently breastfeeding.  General: General appearance - alert, well appearing, and in no distress and oriented to person, place, and time Chest - clear to auscultation, no wheezes, rales or rhonchi, symmetric air entry Heart - normal rate and regular rhythm Abdomen - soft, nontender, nondistended, no masses or organomegaly Pelvic - VAGINA: vaginal discharge - white, curd-like and thick, WET MOUNT done - results:yeast. Negative for WBC, trich, clue cells Extremities - no pedal edema noted   Labs: Results for orders placed during the hospital encounter of 10/04/12 (from the past 24 hour(s))  URINALYSIS, ROUTINE W REFLEX MICROSCOPIC   Collection Time    10/04/12 10:41 PM      Result Value Range   Color, Urine YELLOW  YELLOW   APPearance CLEAR  CLEAR   Specific Gravity, Urine >1.030 (*) 1.005 - 1.030   pH 6.0  5.0 - 8.0  Glucose, UA NEGATIVE  NEGATIVE mg/dL   Hgb urine dipstick NEGATIVE  NEGATIVE   Bilirubin Urine NEGATIVE  NEGATIVE   Ketones, ur NEGATIVE  NEGATIVE mg/dL   Protein, ur NEGATIVE  NEGATIVE mg/dL   Urobilinogen, UA 0.2  0.0 - 1.0 mg/dL   Nitrite NEGATIVE  NEGATIVE   Leukocytes, UA NEGATIVE  NEGATIVE  POCT PREGNANCY, URINE   Collection Time    10/04/12 11:01 PM      Result Value Range   Preg Test, Ur NEGATIVE  NEGATIVE   IAssessment: Patient Active Problem List   Diagnosis Date Noted  . Anemia 09/06/2012  . Excessive or frequent menstruation 09/06/2012  . Normal delivery 02/23/2012  . Active  labor 02/22/2012    Plan: Diflucan X1;  Pt to see GYN MD if problem persists or recurs   CRESENZO-DISHMAN,Shyheem Whitham

## 2012-10-04 NOTE — MAU Note (Signed)
PT SAYS  SHE  HAD VAG DEL ON 9-26.  BREAST/ BOTTLE  FEEDING     SAYS HAS VAG D/C ON   ON SAT- THEN SHE USED MONISTAT 7 DAY.- NOW NO MORE  D/C BUT SHE HAS  ITCHING.    SHE STARTED ON BIRTH CONTROL LAST MTH- AND SEEMS LIKE  STARTED HAVING D/C.      D/C - SMELL LIKE YEAST-   LOOK LIKE COTTAGE CHEESE.    LAST SEX-  4-25.    DR Jean Rosenthal- [facility]- [facility].   NEXT APPOINTMENT IS IN June.

## 2012-10-04 NOTE — MAU Provider Note (Signed)
Attestation of Attending Supervision of Advanced Practitioner: Evaluation and management procedures were performed by the PA/NP/CNM/OB Fellow under my supervision/collaboration. Chart reviewed and agree with management and plan.  Addilynne Olheiser V 10/04/2012 11:23 PM

## 2012-10-31 ENCOUNTER — Ambulatory Visit: Payer: Medicaid Other | Admitting: Obstetrics

## 2013-01-25 ENCOUNTER — Inpatient Hospital Stay (HOSPITAL_COMMUNITY): Payer: 59

## 2013-01-25 ENCOUNTER — Encounter (HOSPITAL_COMMUNITY): Payer: Self-pay | Admitting: *Deleted

## 2013-01-25 ENCOUNTER — Inpatient Hospital Stay (HOSPITAL_COMMUNITY)
Admission: AD | Admit: 2013-01-25 | Discharge: 2013-01-25 | Disposition: A | Discharge status: Home / Self Care | Payer: 59 | Source: Ambulatory Visit | Attending: Obstetrics & Gynecology | Admitting: Obstetrics & Gynecology

## 2013-01-25 DIAGNOSIS — O209 Hemorrhage in early pregnancy, unspecified: Secondary | ICD-10-CM

## 2013-01-25 DIAGNOSIS — R109 Unspecified abdominal pain: Secondary | ICD-10-CM | POA: Insufficient documentation

## 2013-01-25 DIAGNOSIS — N760 Acute vaginitis: Secondary | ICD-10-CM

## 2013-01-25 DIAGNOSIS — B9689 Other specified bacterial agents as the cause of diseases classified elsewhere: Secondary | ICD-10-CM

## 2013-01-25 DIAGNOSIS — O4690 Antepartum hemorrhage, unspecified, unspecified trimester: Secondary | ICD-10-CM

## 2013-01-25 LAB — WET PREP, GENITAL: Yeast Wet Prep HPF POC: NONE SEEN

## 2013-01-25 LAB — URINALYSIS, ROUTINE W REFLEX MICROSCOPIC
Bilirubin Urine: NEGATIVE
Glucose, UA: NEGATIVE mg/dL
Specific Gravity, Urine: 1.015 (ref 1.005–1.030)
Urobilinogen, UA: 0.2 mg/dL (ref 0.0–1.0)
pH: 8 (ref 5.0–8.0)

## 2013-01-25 LAB — CBC
HCT: 34.1 % — ABNORMAL LOW (ref 36.0–46.0)
Hemoglobin: 11.5 g/dL — ABNORMAL LOW (ref 12.0–15.0)
MCH: 28.9 pg (ref 26.0–34.0)
RBC: 3.98 MIL/uL (ref 3.87–5.11)

## 2013-01-25 LAB — URINE MICROSCOPIC-ADD ON

## 2013-01-25 MED ORDER — METRONIDAZOLE 500 MG PO TABS: 500.0000 mg | ORAL_TABLET | Freq: Two times a day (BID) | ORAL | Status: DC

## 2013-01-25 NOTE — MAU Note (Signed)
Patient states started having abdominal cramping and bleeding this am. LMP July 28th. Patient states having to wear a liner. Patient took two HPT on Monday and both were positive.

## 2013-01-25 NOTE — MAU Provider Note (Signed)
History     CSN: [id number]  Arrival date and time: 01/25/13 1757   First Provider Initiated Contact with Patient 01/25/13 1923      Chief Complaint  Patient presents with  . Abdominal Pain  . Vaginal Bleeding   HPI  Tracey Whitney is a 22 y.o. G61P2002 female; unknown gestation who presents to MAU with abdominal pain and two positive pregnancy tests at home.  LMP December 24, 2012. The abdominal pain started today and has progressively gotten worse; the pain sharp, and cramping.  She is also having vagina bleeding that started around the same time as the abdominal pain. The bleeding started as pink in color and has gotten darker throughout the day. She plans to receive prenatal care at [facility].    OB History   Grav Para Term Preterm Abortions TAB SAB Ect Mult Living   2 2 2       2       Past Medical History  Diagnosis Date  . Headache(784.0)   . Anemia     Past Surgical History  Procedure Laterality Date  . Wisdom tooth extraction      Family History  Problem Relation Age of Onset  . Cancer Paternal Aunt   . Other Neg Hx     History  Substance Use Topics  . Smoking status: Never Smoker   . Smokeless tobacco: Not on file  . Alcohol Use: No    Allergies:  Allergies  Allergen Reactions  . Latex Itching    Prescriptions prior to admission  Medication Sig Dispense Refill  . cyclobenzaprine (FLEXERIL) 10 MG tablet Take 1 tablet (10 mg total) by mouth 3 (three) times daily as needed.  21 tablet  0  . HYDROcodone-acetaminophen (NORCO/VICODIN) 5-325 MG per tablet Take one-two tabs po q 4-6 hrs prn pain  15 tablet  0  . ibuprofen (ADVIL,MOTRIN) 600 MG tablet Take 1 tablet (600 mg total) by mouth every 6 (six) hours.  30 tablet  5  . Iron-FA-B Cmp-C-Biot-Probiotic (FUSION PLUS) CAPS Take 1 capsule by mouth daily before breakfast.  30 capsule  11  . norethindrone (MICRONOR,CAMILA,ERRIN) 0.35 MG tablet Take 1 tablet (0.35 mg total) by mouth daily.  1  Package  11  . Prenatal Vit-Fe Fumarate-FA (PRENATAL MULTIVITAMIN) TABS Take 1 tablet by mouth at bedtime.       Results for orders placed during the hospital encounter of 01/25/13 (from the past 24 hour(s))  URINALYSIS, ROUTINE W REFLEX MICROSCOPIC     Status: Abnormal   Collection Time    01/25/13  6:00 PM      Result Value Range   Color, Urine YELLOW  YELLOW   APPearance CLEAR  CLEAR   Specific Gravity, Urine 1.015  1.005 - 1.030   pH 8.0  5.0 - 8.0   Glucose, UA NEGATIVE  NEGATIVE mg/dL   Hgb urine dipstick LARGE (*) NEGATIVE   Bilirubin Urine NEGATIVE  NEGATIVE   Ketones, ur NEGATIVE  NEGATIVE mg/dL   Protein, ur NEGATIVE  NEGATIVE mg/dL   Urobilinogen, UA 0.2  0.0 - 1.0 mg/dL   Nitrite NEGATIVE  NEGATIVE   Leukocytes, UA NEGATIVE  NEGATIVE  URINE MICROSCOPIC-ADD ON     Status: None   Collection Time    01/25/13  6:00 PM      Result Value Range   Squamous Epithelial / LPF RARE  RARE   WBC, UA 0-2  <3 WBC/hpf   RBC / HPF 21-50  <  3 RBC/hpf  HCG, QUANTITATIVE, PREGNANCY     Status: Abnormal   Collection Time    01/25/13  7:10 PM      Result Value Range   hCG, Beta Chain, Quant, S 15 (*) <5 mIU/mL  WET PREP, GENITAL     Status: Abnormal   Collection Time    01/25/13  7:34 PM      Result Value Range   Yeast Wet Prep HPF POC NONE SEEN  NONE SEEN   Trich, Wet Prep NONE SEEN  NONE SEEN   Clue Cells Wet Prep HPF POC MANY (*) NONE SEEN   WBC, Wet Prep HPF POC NONE SEEN  NONE SEEN    Review of Systems  Constitutional: Negative for fever and chills.  Gastrointestinal: Positive for abdominal pain, diarrhea and constipation. Negative for heartburn, nausea and vomiting.       Left, lower abdominal pain  Genitourinary: Negative for dysuria, urgency and frequency.       No vaginal discharge. Vaginal bleeding; bright red, moderate amount  No dysuria.   Neurological: Positive for headaches.   Physical Exam   Blood pressure 127/69, pulse 70, temperature 98 F (36.7 C),  temperature source Oral, resp. rate 20, height 5\' 9"  (1.753 m), weight 83.575 kg (184 lb 4 oz), last menstrual period 12/24/2012.  Physical Exam  Constitutional: She appears well-developed and well-nourished. No distress.  Neck: Neck supple.  GI: Soft. She exhibits no distension. There is no tenderness. There is no rebound and no guarding.  Genitourinary:  Speculum exam: Vagina - Moderate-Large amount of bright red blood in vault; no odor, many small clots  Cervix - Difficult to visualize with the amount of blood Bimanual exam: Cervix is open 0.5-1 cm Uterus non tender, normal size Adnexa non tender, no masses bilaterally GC/Chlam, wet prep done Chaperone present for exam.   Skin: She is not diaphoretic.    MAU Course  Procedures  POCT pregnancy test was negative Beta Hcg CBC Wet prep GC/Chlamydia- pending   OB US   Assessment and Plan   Report given to Georges Mouse CNM who resumed care of this patient   Debbrah Alar FNP-C 01/25/2013, 8:06 PM   Results for orders placed during the hospital encounter of 01/25/13 (from the past 24 hour(s))  URINALYSIS, ROUTINE W REFLEX MICROSCOPIC     Status: Abnormal   Collection Time    01/25/13  6:00 PM      Result Value Range   Color, Urine YELLOW  YELLOW   APPearance CLEAR  CLEAR   Specific Gravity, Urine 1.015  1.005 - 1.030   pH 8.0  5.0 - 8.0   Glucose, UA NEGATIVE  NEGATIVE mg/dL   Hgb urine dipstick LARGE (*) NEGATIVE   Bilirubin Urine NEGATIVE  NEGATIVE   Ketones, ur NEGATIVE  NEGATIVE mg/dL   Protein, ur NEGATIVE  NEGATIVE mg/dL   Urobilinogen, UA 0.2  0.0 - 1.0 mg/dL   Nitrite NEGATIVE  NEGATIVE   Leukocytes, UA NEGATIVE  NEGATIVE  URINE MICROSCOPIC-ADD ON     Status: None   Collection Time    01/25/13  6:00 PM      Result Value Range   Squamous Epithelial / LPF RARE  RARE   WBC, UA 0-2  <3 WBC/hpf   RBC / HPF 21-50  <3 RBC/hpf  HCG, QUANTITATIVE, PREGNANCY     Status: Abnormal   Collection Time     01/25/13  7:10 PM      Result  Value Range   hCG, Beta Chain, Quant, S 15 (*) <5 mIU/mL  CBC     Status: Abnormal   Collection Time    01/25/13  7:10 PM      Result Value Range   WBC 5.4  4.0 - 10.5 K/uL   RBC 3.98  3.87 - 5.11 MIL/uL   Hemoglobin 11.5 (*) 12.0 - 15.0 g/dL   HCT 16.1 (*) 09.6 - 04.5 %   MCV 85.7  78.0 - 100.0 fL   MCH 28.9  26.0 - 34.0 pg   MCHC 33.7  30.0 - 36.0 g/dL   RDW 40.9  81.1 - 91.4 %   Platelets 185  150 - 400 K/uL  WET PREP, GENITAL     Status: Abnormal   Collection Time    01/25/13  7:34 PM      Result Value Range   Yeast Wet Prep HPF POC NONE SEEN  NONE SEEN   Trich, Wet Prep NONE SEEN  NONE SEEN   Clue Cells Wet Prep HPF POC MANY (*) NONE SEEN   WBC, Wet Prep HPF POC NONE SEEN  NONE SEEN   US Ob Comp Less 14 Wks  01/25/2013   *RADIOLOGY REPORT*  Clinical Data: Vaginal bleeding early pregnancy.  OBSTETRIC <14 WK [place] AND TRANSVAGINAL OB US  Technique:  Both transabdominal and transvaginal ultrasound examinations were performed for complete evaluation of the gestation as well as the maternal uterus, adnexal regions, and pelvic cul-de-sac.  Transvaginal technique was performed to assess early pregnancy.  Comparison:  01/15/2010.  Intrauterine gestational sac:  Not visualized. Yolk sac: Not visualized. Embryo: Not visualized. Cardiac Activity: Not applicable.  Maternal uterus/adnexae: Unremarkable.  The uterus has normal size and morphology.  The endometrial stripe is homogeneous.  The ovaries are not enlarged. Noted corpus luteum within the right ovary, with small volume layering hemorrhage.  IMPRESSION: No intrauterine gestational sac, yolk sac, fetal pole, or cardiac activity visualized. Differential considerations include intrauterine gestation too early to be sonographically visualized, spontaneous abortion, or ectopic pregnancy.  Consider follow-up ultrasound in 10 days and serial quantitative beta HCG follow-up.   Original Report Authenticated By: Tiburcio Pea   US Ob Transvaginal  01/25/2013   *RADIOLOGY REPORT*  Clinical Data: Vaginal bleeding early pregnancy.  OBSTETRIC <14 WK [place] AND TRANSVAGINAL OB US  Technique:  Both transabdominal and transvaginal ultrasound examinations were performed for complete evaluation of the gestation as well as the maternal uterus, adnexal regions, and pelvic cul-de-sac.  Transvaginal technique was performed to assess early pregnancy.  Comparison:  01/15/2010.  Intrauterine gestational sac:  Not visualized. Yolk sac: Not visualized. Embryo: Not visualized. Cardiac Activity: Not applicable.  Maternal uterus/adnexae: Unremarkable.  The uterus has normal size and morphology.  The endometrial stripe is homogeneous.  The ovaries are not enlarged. Noted corpus luteum within the right ovary, with small volume layering hemorrhage.  IMPRESSION: No intrauterine gestational sac, yolk sac, fetal pole, or cardiac activity visualized. Differential considerations include intrauterine gestation too early to be sonographically visualized, spontaneous abortion, or ectopic pregnancy.  Consider follow-up ultrasound in 10 days and serial quantitative beta HCG follow-up.   Original Report Authenticated By: Tiburcio Pea    A/P:  1. Vaginal bleeding in pregnancy, first trimester   discussed with patient that this is most likely a miscarriage, but we can not completely r/o normal preg or ectopic tonight. rev'd precautions, f/u for repeat quant in 48 hours.     Medication List  diphenhydrAMINE 25 MG tablet  Commonly known as:  BENADRYL  Take 50 mg by mouth every 6 (six) hours as needed for itching or allergies.            Follow-up Information   Follow up with [facility] On 01/28/2013. (for repeat labs)    Contact information:   [id number] [place] [address] [phone]

## 2013-01-26 LAB — GC/CHLAMYDIA PROBE AMP
CT Probe RNA: NEGATIVE
GC Probe RNA: NEGATIVE

## 2013-01-28 ENCOUNTER — Inpatient Hospital Stay (HOSPITAL_COMMUNITY)
Admission: AD | Admit: 2013-01-28 | Discharge: 2013-01-28 | Disposition: A | Discharge status: Home / Self Care | Payer: 59 | Source: Ambulatory Visit | Attending: Obstetrics and Gynecology | Admitting: Obstetrics and Gynecology

## 2013-01-28 DIAGNOSIS — O039 Complete or unspecified spontaneous abortion without complication: Secondary | ICD-10-CM | POA: Insufficient documentation

## 2013-01-28 MED ORDER — OXYCODONE-ACETAMINOPHEN 5-325 MG PO TABS: 1.0000 | ORAL_TABLET | ORAL | Status: DC | PRN

## 2013-01-28 NOTE — MAU Provider Note (Signed)
Tracey Whitney is a 22 y.o. G2P2002 at Unknown who presents to MAU today for a follow-up quant hCG after US showed a possible SAB on 01/26/13. Patient states that bleeding has stopped, but she continues to have cramping in her lower back and abdomen.   LMP 12/24/2012 GENERAL: Well-developed, well-nourished female in no acute distress.  HEENT: Normocephalic, atraumatic.   LUNGS: Effort normal HEART: Regular rate  SKIN: Warm, dry and without erythema PSYCH: Normal mood and affect  Results for orders placed during the hospital encounter of 01/28/13 (from the past 24 hour(s))  HCG, QUANTITATIVE, PREGNANCY     Status: None   Collection Time    01/28/13  3:50 PM      Result Value Range   hCG, Beta Chain, Quant, S 3  <5 mIU/mL    A: SAB  P: Discharge home Rx for Percocet given to patient. Also instructed to use Ibuprofen PRN Patient referred to [facility] clinic for follow-up in ~ 2 weeks Patient may return to MAU as needed or if her condition were to change or worsen  Freddi Starr, PA-C 01/28/2013 4:40 PM

## 2013-01-29 NOTE — MAU Provider Note (Signed)
Attestation of Attending Supervision of Advanced Practitioner (CNM/NP): Evaluation and management procedures were performed by the Advanced Practitioner under my supervision and collaboration.  I have reviewed the Advanced Practitioner's note and chart, and I agree with the management and plan.  Madasyn Heath 01/29/2013 8:05 AM   

## 2013-02-11 ENCOUNTER — Ambulatory Visit (INDEPENDENT_AMBULATORY_CARE_PROVIDER_SITE_OTHER): Payer: 59 | Admitting: Obstetrics & Gynecology

## 2013-02-11 ENCOUNTER — Encounter: Payer: Self-pay | Admitting: Obstetrics & Gynecology

## 2013-02-11 VITALS — BP 134/68 | HR 83 | Temp 98.0°F | Ht 69.0 in | Wt 187.2 lb

## 2013-02-11 DIAGNOSIS — O039 Complete or unspecified spontaneous abortion without complication: Secondary | ICD-10-CM

## 2013-02-11 DIAGNOSIS — D649 Anemia, unspecified: Secondary | ICD-10-CM

## 2013-02-11 LAB — CBC
HCT: 36.2 % (ref 36.0–46.0)
Hemoglobin: 12.3 g/dL (ref 12.0–15.0)
MCH: 28.5 pg (ref 26.0–34.0)
MCHC: 34 g/dL (ref 30.0–36.0)
MCV: 84 fL (ref 78.0–100.0)

## 2013-02-11 NOTE — Progress Notes (Signed)
Patient ID: Tracey Whitney, female   DOB: April 26, 1991, 22 y.o.   MRN: [id number]  Chief Complaint  Patient presents with  . Miscarriage    HPI Tracey Whitney is a 22 y.o. female.  [id number] Patient's last menstrual period was 12/24/2012. Patient had early miscarriage end of August, beta HCG was 15-3. No IUP on [place]. Doesn't want BCM. Wishes to be checked for anemia  HPI  Past Medical History  Diagnosis Date  . Headache(784.0)   . Anemia     Past Surgical History  Procedure Laterality Date  . Wisdom tooth extraction      Family History  Problem Relation Age of Onset  . Cancer Paternal Aunt   . Other Neg Hx     Social History History  Substance Use Topics  . Smoking status: Never Smoker   . Smokeless tobacco: Not on file  . Alcohol Use: No    Allergies  Allergen Reactions  . Latex Itching    Current Outpatient Prescriptions  Medication Sig Dispense Refill  . oxyCODONE-acetaminophen (PERCOCET/ROXICET) 5-325 MG per tablet Take 1-2 tablets by mouth every 4 (four) hours as needed for pain.  15 tablet  0  . diphenhydrAMINE (BENADRYL) 25 MG tablet Take 50 mg by mouth every 6 (six) hours as needed for itching or allergies.      . metroNIDAZOLE (FLAGYL) 500 MG tablet Take 1 tablet (500 mg total) by mouth 2 (two) times daily.  14 tablet  0   No current facility-administered medications for this visit.    Review of Systems Review of Systems  Constitutional: Positive for fatigue. Negative for fever.  Genitourinary: Negative for vaginal bleeding, vaginal discharge and menstrual problem.    Blood pressure 134/68, pulse 83, temperature 98 F (36.7 C), height 5\' 9"  (1.753 m), weight 187 lb 3.2 oz (84.913 kg), last menstrual period 12/24/2012.  Physical Exam Physical Exam  Constitutional: She is oriented to person, place, and time. She appears well-developed. No distress.  Pulmonary/Chest: Effort normal. No respiratory distress.  Abdominal: Soft. There is no tenderness.   Neurological: She is alert and oriented to person, place, and time.  Skin: Skin is warm. No pallor.  Psychiatric: She has a normal mood and affect. Her behavior is normal.    Data Reviewed ED notes, lable  Assessment    S/P early Sab     Plan    CBC Expectant management        Sarissa Dern 02/11/2013, 3:10 PM

## 2013-02-11 NOTE — Patient Instructions (Addendum)
Miscarriage A miscarriage is the sudden loss of an unborn baby (fetus) before the 20th week of pregnancy. Most miscarriages happen in the first 3 months of pregnancy. Sometimes, it happens before a woman even knows she is pregnant. A miscarriage is also called a "spontaneous miscarriage" or "early pregnancy loss." Having a miscarriage can be an emotional experience. Talk with your caregiver about any questions you may have about miscarrying, the grieving process, and your future pregnancy plans. CAUSES   Problems with the fetal chromosomes that make it impossible for the baby to develop normally. Problems with the baby's genes or chromosomes are most often the result of errors that occur, by chance, as the embryo divides and grows. The problems are not inherited from the parents.  Infection of the cervix or uterus.   Hormone problems.   Problems with the cervix, such as having an incompetent cervix. This is when the tissue in the cervix is not strong enough to hold the pregnancy.   Problems with the uterus, such as an abnormally shaped uterus, uterine fibroids, or congenital abnormalities.   Certain medical conditions.   Smoking, drinking alcohol, or taking illegal drugs.   Trauma.  Often, the cause of a miscarriage is unknown.  SYMPTOMS   Vaginal bleeding or spotting, with or without cramps or pain.  Pain or cramping in the abdomen or lower back.  Passing fluid, tissue, or blood clots from the vagina. DIAGNOSIS  Your caregiver will perform a physical exam. You may also have an ultrasound to confirm the miscarriage. Blood or urine tests may also be ordered. TREATMENT   Sometimes, treatment is not necessary if you naturally pass all the fetal tissue that was in the uterus. If some of the fetus or placenta remains in the body (incomplete miscarriage), tissue left behind may become infected and must be removed. Usually, a dilation and curettage (D and C) procedure is performed.  During a D and C procedure, the cervix is widened (dilated) and any remaining fetal or placental tissue is gently removed from the uterus.  Antibiotic medicines are prescribed if there is an infection. Other medicines may be given to reduce the size of the uterus (contract) if there is a lot of bleeding.  If you have Rh negative blood and your baby was Rh positive, you will need a Rh immunoglobulin shot. This shot will protect any future baby from having Rh blood problems in future pregnancies. HOME CARE INSTRUCTIONS   Your caregiver may order bed rest or may allow you to continue light activity. Resume activity as directed by your caregiver.  Have someone help with home and family responsibilities during this time.   Keep track of the number of sanitary pads you use each day and how soaked (saturated) they are. Write down this information.   Do not use tampons. Do not douche or have sexual intercourse until approved by your caregiver.   Only take over-the-counter or prescription medicines for pain or discomfort as directed by your caregiver.   Do not take aspirin. Aspirin can cause bleeding.   Keep all follow-up appointments with your caregiver.   If you or your partner have problems with grieving, talk to your caregiver or seek counseling to help cope with the pregnancy loss. Allow enough time to grieve before trying to get pregnant again.  SEEK IMMEDIATE MEDICAL CARE IF:   You have severe cramps or pain in your back or abdomen.  You have a fever.  You pass large blood clots (walnut-sized   or larger) ortissue from your vagina. Save any tissue for your caregiver to inspect.   Your bleeding increases.   You have a thick, bad-smelling vaginal discharge.  You become lightheaded, weak, or you faint.   You have chills.  MAKE SURE YOU:  Understand these instructions.  Will watch your condition.  Will get help right away if you are not doing well or get  worse. Document Released: 11/09/2000 Document Revised: 11/15/2011 Document Reviewed: 07/05/2011 [employer] Patient Information 2014 [employer].  

## 2013-04-29 ENCOUNTER — Other Ambulatory Visit: Payer: Self-pay | Admitting: Family Medicine

## 2013-04-29 DIAGNOSIS — E049 Nontoxic goiter, unspecified: Secondary | ICD-10-CM

## 2013-05-02 ENCOUNTER — Ambulatory Visit
Admission: RE | Admit: 2013-05-02 | Discharge: 2013-05-02 | Disposition: A | Discharge status: Home / Self Care | Payer: 59 | Source: Ambulatory Visit | Attending: Family Medicine | Admitting: Family Medicine

## 2013-05-02 DIAGNOSIS — E049 Nontoxic goiter, unspecified: Secondary | ICD-10-CM

## 2013-05-03 ENCOUNTER — Encounter (HOSPITAL_COMMUNITY): Payer: Self-pay | Admitting: Emergency Medicine

## 2013-05-03 ENCOUNTER — Emergency Department (HOSPITAL_COMMUNITY): Payer: 59

## 2013-05-03 ENCOUNTER — Emergency Department (HOSPITAL_COMMUNITY)
Admission: EM | Admit: 2013-05-03 | Discharge: 2013-05-04 | Disposition: A | Discharge status: Home / Self Care | Payer: 59 | Attending: Emergency Medicine | Admitting: Emergency Medicine

## 2013-05-03 DIAGNOSIS — R110 Nausea: Secondary | ICD-10-CM | POA: Insufficient documentation

## 2013-05-03 DIAGNOSIS — Z3202 Encounter for pregnancy test, result negative: Secondary | ICD-10-CM | POA: Insufficient documentation

## 2013-05-03 DIAGNOSIS — R109 Unspecified abdominal pain: Secondary | ICD-10-CM | POA: Insufficient documentation

## 2013-05-03 DIAGNOSIS — R42 Dizziness and giddiness: Secondary | ICD-10-CM | POA: Insufficient documentation

## 2013-05-03 DIAGNOSIS — Z862 Personal history of diseases of the blood and blood-forming organs and certain disorders involving the immune mechanism: Secondary | ICD-10-CM | POA: Insufficient documentation

## 2013-05-03 DIAGNOSIS — Z91040 Latex allergy status: Secondary | ICD-10-CM | POA: Insufficient documentation

## 2013-05-03 LAB — CBC WITH DIFFERENTIAL/PLATELET
Basophils Relative: 1 % (ref 0–1)
Eosinophils Absolute: 0.1 10*3/uL (ref 0.0–0.7)
HCT: 36 % (ref 36.0–46.0)
Hemoglobin: 12 g/dL (ref 12.0–15.0)
Lymphs Abs: 1.9 10*3/uL (ref 0.7–4.0)
MCH: 28.8 pg (ref 26.0–34.0)
MCHC: 33.3 g/dL (ref 30.0–36.0)
Monocytes Absolute: 0.4 10*3/uL (ref 0.1–1.0)
Monocytes Relative: 8 % (ref 3–12)
Neutro Abs: 2 10*3/uL (ref 1.7–7.7)

## 2013-05-03 LAB — COMPREHENSIVE METABOLIC PANEL
Albumin: 3.7 g/dL (ref 3.5–5.2)
BUN: 14 mg/dL (ref 6–23)
Chloride: 103 mEq/L (ref 96–112)
Creatinine, Ser: 0.94 mg/dL (ref 0.50–1.10)
GFR calc Af Amer: >90 mL/min (ref >90)
Glucose, Bld: 101 mg/dL — ABNORMAL HIGH (ref 70–99)
Total Bilirubin: 0.4 mg/dL (ref 0.3–1.2)

## 2013-05-03 LAB — URINALYSIS, ROUTINE W REFLEX MICROSCOPIC
Glucose, UA: NEGATIVE mg/dL
Hgb urine dipstick: NEGATIVE
Ketones, ur: NEGATIVE mg/dL
Protein, ur: NEGATIVE mg/dL

## 2013-05-03 LAB — LIPASE, BLOOD: Lipase: 29 U/L (ref 11–59)

## 2013-05-03 MED ORDER — HYDROCODONE-ACETAMINOPHEN 5-325 MG PO TABS
1.0000 | ORAL_TABLET | Freq: Once | ORAL | Status: AC
  Administered 2013-05-03: 1 via ORAL
  Filled 2013-05-03: qty 1

## 2013-05-03 NOTE — ED Notes (Signed)
Pt c/o left sided flank pain, nausea, and dizziness. No urinary symptoms.

## 2013-05-03 NOTE — ED Provider Notes (Signed)
CSN: [id number]     Arrival date & time 05/03/13  2030 History  This chart was scribed for Celene Kras, MD by Ardelia Mems, ED Scribe. This patient was seen in room APA08/APA08 and the patient's care was started at 9:22 PM.   Chief Complaint  Patient presents with  . Flank Pain  . Nausea  . Dizziness    The history is provided by the patient. No language interpreter was used.    HPI Comments: Tracey Whitney is a 22 y.o. female who presents to the Emergency Department complaining of intermittent left flank pain over the past 2 months. She states that her pain has been gradually worsening. She states that her pain began as a mild "pinching" pain 2 months ago, and that her pain is now "sharp, aching, throbbing and  feels like contractions". She states that this pain takes her breath away at times. She states that her pain is worsened with lying supine, walking or sitting. She states that she took Motrin earlier today without relief of her pain. She states that she has been evaluated for this pain, and has made a referral to a specialist. She denies cough, fever, emesis, diarrhea or any other symptoms.  Past Medical History  Diagnosis Date  . Headache(784.0)   . Anemia    Past Surgical History  Procedure Laterality Date  . Wisdom tooth extraction     Family History  Problem Relation Age of Onset  . Cancer Paternal Aunt   . Other Neg Hx    History  Substance Use Topics  . Smoking status: Never Smoker   . Smokeless tobacco: Not on file  . Alcohol Use: No   OB History   Grav Para Term Preterm Abortions TAB SAB Ect Mult Living   3 2 2  1  1   2      Review of Systems  Constitutional: Negative for fever.  Respiratory: Negative for cough.   Gastrointestinal: Negative for vomiting and diarrhea.  Genitourinary: Positive for flank pain (left flank).  All other systems reviewed and are negative.   Allergies  Latex  Home Medications   Current Outpatient Rx  Name  Route  Sig   Dispense  Refill  . ibuprofen (ADVIL,MOTRIN) 200 MG tablet   Oral   Take 800 mg by mouth every 6 (six) hours as needed.         Marland Kitchen HYDROcodone-acetaminophen (NORCO/VICODIN) 5-325 MG per tablet   Oral   Take 1-2 tablets by mouth every 4 (four) hours as needed.   16 tablet   0    Triage Vitals: BP 115/69  Pulse 77  Temp(Src) 97.8 F (36.6 C) (Oral)  Resp 20  Ht 5\' 9"  (1.753 m)  Wt 180 lb (81.647 kg)  BMI 26.57 kg/m2  SpO2 100%  LMP 04/12/2013  Physical Exam  Nursing note and vitals reviewed. Constitutional: She appears well-developed and well-nourished. No distress.  HENT:  Head: Normocephalic and atraumatic.  Right Ear: External ear normal.  Left Ear: External ear normal.  Eyes: Conjunctivae are normal. Right eye exhibits no discharge. Left eye exhibits no discharge. No scleral icterus.  Neck: Neck supple. No tracheal deviation present.  Cardiovascular: Normal rate, regular rhythm and intact distal pulses.   Pulmonary/Chest: Effort normal and breath sounds normal. No stridor. No respiratory distress. She has no wheezes. She has no rales.  Abdominal: Soft. Bowel sounds are normal. She exhibits no distension. There is no tenderness. There is no rebound and no guarding.  Genitourinary:  Mild left CVA tenderness.  Musculoskeletal: She exhibits no edema and no tenderness.  Neurological: She is alert. She has normal strength. No sensory deficit. Cranial nerve deficit:  no gross defecits noted. She exhibits normal muscle tone. She displays no seizure activity. Coordination normal.  Skin: Skin is warm and dry. No rash noted.  Psychiatric: She has a normal mood and affect.    ED Course  Procedures (including critical care time)  DIAGNOSTIC STUDIES: Oxygen Saturation is 100% on RA, normal by my interpretation.    COORDINATION OF CARE: 9:28 PM- Will order Norco for pain. Discussed plan to obtain diagnostic lab work and a CXR. Pt advised of plan for treatment and pt  agrees.  Medications  HYDROcodone-acetaminophen (NORCO/VICODIN) 5-325 MG per tablet 1 tablet (1 tablet Oral Given 05/03/13 2147)  HYDROcodone-acetaminophen (NORCO/VICODIN) 5-325 MG per tablet 1 tablet (1 tablet Oral Given 05/03/13 2308)   Labs Review Labs Reviewed  COMPREHENSIVE METABOLIC PANEL - Abnormal; Notable for the following:    Glucose, Bld 101 (*)    GFR calc non Af Amer 84 (*)    All other components within normal limits  URINALYSIS, ROUTINE W REFLEX MICROSCOPIC  PREGNANCY, URINE  CBC WITH DIFFERENTIAL  LIPASE, BLOOD   Imaging Review Ct Abdomen Pelvis Wo Contrast  05/03/2013   CLINICAL DATA:  22 year old with left flank pain.  EXAM: CT ABDOMEN AND PELVIS WITHOUT CONTRAST  TECHNIQUE: Multidetector CT imaging of the abdomen and pelvis was performed following the standard protocol without intravenous contrast.  COMPARISON:  None.  FINDINGS: Lung bases are clear. No evidence for free air. Normal appearance of the liver, gallbladder, spleen, pancreas and adrenal tissue.  No evidence for kidney stones or hydronephrosis. No definite ureter stones. Limited evaluation of the uterus and adnexal tissue. Appendix appears to be within normal limits. No gross abnormality to the small or large bowel. No acute bone abnormality.  IMPRESSION: Negative CT of the abdomen and pelvis. No evidence for kidney stones or hydronephrosis.   Electronically Signed   By: Richarda Overlie M.D.   On: 05/03/2013 23:56   Dg Chest 2 View  05/03/2013   CLINICAL DATA:  Flank pain.  EXAM: CHEST  2 VIEW  COMPARISON:  None.  FINDINGS: The heart size and mediastinal contours are within normal limits. Both lungs are clear. The visualized skeletal structures are unremarkable.  IMPRESSION: No active cardiopulmonary disease.   Electronically Signed   By: Richarda Overlie M.D.   On: 05/03/2013 22:26    EKG Interpretation   None       MDM   1. Flank pain    NO source noted for patient's pain.  At this time there does not appear to  be any evidence of an acute emergency medical condition and the patient appears stable for discharge with appropriate outpatient follow up.   I personally performed the services described in this documentation, which was scribed in my presence.  The recorded information has been reviewed and is accurate.    Celene Kras, MD 05/04/13 [phone]

## 2013-05-04 MED ORDER — HYDROCODONE-ACETAMINOPHEN 5-325 MG PO TABS: 1.0000 | ORAL_TABLET | ORAL | Status: DC | PRN

## 2013-06-11 ENCOUNTER — Telehealth: Payer: Self-pay | Admitting: Neurology

## 2013-06-11 ENCOUNTER — Ambulatory Visit: Payer: 59 | Admitting: Neurology

## 2013-06-11 ENCOUNTER — Encounter: Payer: Self-pay | Admitting: Neurology

## 2013-09-09 NOTE — Telephone Encounter (Signed)
error 

## 2014-03-31 ENCOUNTER — Encounter (HOSPITAL_COMMUNITY): Payer: Self-pay | Admitting: Emergency Medicine

## 2014-05-05 ENCOUNTER — Other Ambulatory Visit: Payer: 59

## 2014-05-05 ENCOUNTER — Other Ambulatory Visit: Payer: Self-pay | Admitting: Family Medicine

## 2014-05-05 DIAGNOSIS — E041 Nontoxic single thyroid nodule: Secondary | ICD-10-CM

## 2014-05-07 ENCOUNTER — Inpatient Hospital Stay: Admission: RE | Admit: 2014-05-07 | Payer: 59 | Source: Ambulatory Visit

## 2014-08-18 ENCOUNTER — Inpatient Hospital Stay (HOSPITAL_COMMUNITY)
Admission: AD | Admit: 2014-08-18 | Discharge: 2014-08-18 | Disposition: A | Discharge status: Home / Self Care | Payer: 59 | Source: Ambulatory Visit | Attending: Obstetrics & Gynecology | Admitting: Obstetrics & Gynecology

## 2014-08-18 ENCOUNTER — Encounter (HOSPITAL_COMMUNITY): Payer: Self-pay | Admitting: *Deleted

## 2014-08-18 DIAGNOSIS — N938 Other specified abnormal uterine and vaginal bleeding: Secondary | ICD-10-CM | POA: Diagnosis present

## 2014-08-18 DIAGNOSIS — D649 Anemia, unspecified: Secondary | ICD-10-CM | POA: Diagnosis not present

## 2014-08-18 LAB — CBC
HEMATOCRIT: 33.9 % — AB (ref 36.0–46.0)
Hemoglobin: 11.4 g/dL — ABNORMAL LOW (ref 12.0–15.0)
MCH: 29 pg (ref 26.0–34.0)
MCHC: 33.6 g/dL (ref 30.0–36.0)
MCV: 86.3 fL (ref 78.0–100.0)
Platelets: 193 10*3/uL (ref 150–400)
RBC: 3.93 MIL/uL (ref 3.87–5.11)
RDW: 13.6 % (ref 11.5–15.5)
WBC: 3.8 10*3/uL — AB (ref 4.0–10.5)

## 2014-08-18 LAB — URINE MICROSCOPIC-ADD ON

## 2014-08-18 LAB — URINALYSIS, ROUTINE W REFLEX MICROSCOPIC
BILIRUBIN URINE: NEGATIVE
Glucose, UA: NEGATIVE mg/dL
KETONES UR: NEGATIVE mg/dL
LEUKOCYTES UA: NEGATIVE
Nitrite: NEGATIVE
Protein, ur: NEGATIVE mg/dL
SPECIFIC GRAVITY, URINE: 1.02 (ref 1.005–1.030)
Urobilinogen, UA: 0.2 mg/dL (ref 0.0–1.0)
pH: 6 (ref 5.0–8.0)

## 2014-08-18 LAB — POCT PREGNANCY, URINE: PREG TEST UR: NEGATIVE

## 2014-08-18 NOTE — MAU Note (Signed)
Pt on birth control pills for the past 3 months, started bleeding last week, feeling dizzy, hx of anemia.  Pt states it was not time for her period according to the pills when she started bleeding.

## 2014-08-18 NOTE — MAU Provider Note (Signed)
History   BF with hx of anemia in with c/o bleeding for 2 weeks. States just started new pill pack in her 2nd day of it. But denies missing any pills, any GI upsets, or antibiotic use. This is first occurrence since starting these oc's 3 months ago. Pad changed x 1 today.  CSN: [id number]  Arrival date and time: 08/18/14 1524   First Provider Initiated Contact with Patient 08/18/14 1622      Chief Complaint  Patient presents with  . Vaginal Bleeding  . Abdominal Pain   Vaginal Bleeding This is a new problem. The current episode started 1 to 4 weeks ago. The problem has been unchanged. The patient is experiencing no pain. She is not pregnant. Associated symptoms include abdominal pain.  Abdominal Pain    OB History    Gravida Para Term Preterm AB TAB SAB Ectopic Multiple Living   Past Medical History  Diagnosis Date  . Headache(784.0)   . Anemia     Past Surgical History  Procedure Laterality Date  . Wisdom tooth extraction      Family History  Problem Relation Age of Onset  . Cancer Paternal Aunt   . Other Neg Hx     History  Substance Use Topics  . Smoking status: Never Smoker   . Smokeless tobacco: Not on file  . Alcohol Use: Yes     Comment: occ    Allergies:  Allergies  Allergen Reactions  . Latex Itching    Prescriptions prior to admission  Medication Sig Dispense Refill Last Dose  . Biotin w/ Vitamins C & E (HAIR SKIN & NAILS GUMMIES PO) Take 2 tablets by mouth 2 (two) times daily.   08/18/2014 at Unknown time  . HYDROcodone-acetaminophen (NORCO/VICODIN) 5-325 MG per tablet Take 1-2 tablets by mouth every 4 (four) hours as needed. (Patient not taking: Reported on 08/18/2014) 16 tablet 0 more than one month  . ibuprofen (ADVIL,MOTRIN) 200 MG tablet Take 800 mg by mouth every 6 (six) hours as needed for moderate pain.    more than one month  . norgestimate-ethinyl estradiol (ORTHO-CYCLEN,SPRINTEC,PREVIFEM) 0.25-35 MG-MCG tablet  Take 1 tablet by mouth daily.   08/17/2014 at Unknown time    Review of Systems  Constitutional: Negative.   HENT: Negative.   Eyes: Negative.   Respiratory: Negative.   Cardiovascular: Negative.   Gastrointestinal: Positive for abdominal pain.  Genitourinary: Positive for vaginal bleeding.  Musculoskeletal: Negative.   Skin: Negative.   Neurological: Negative.   Endo/Heme/Allergies: Negative.   Psychiatric/Behavioral: Negative.    Physical Exam   Blood pressure 112/61, pulse 69, temperature 98.1 F (36.7 C), temperature source Oral, resp. rate 16, last menstrual period 08/13/2014.  Physical Exam  Constitutional: She is oriented to person, place, and time. She appears well-developed and well-nourished.  HENT:  Head: Normocephalic.  Eyes: Pupils are equal, round, and reactive to light.  Neck: Normal range of motion.  Cardiovascular: Normal rate, regular rhythm, normal heart sounds and intact distal pulses.   Respiratory: Effort normal and breath sounds normal.  GI: Soft. Bowel sounds are normal.  Genitourinary: Vagina normal.  Musculoskeletal: Normal range of motion.  Neurological: She is alert and oriented to person, place, and time. She has normal reflexes.  Skin: Skin is warm and dry.  Psychiatric: She has a normal mood and affect. Her behavior is normal. Judgment and thought content normal.    MAU  Course  Procedures  MDM DUB, anemia   Assessment and Plan  DUB, will get CBC to check hgb. Exam revels scant amt dark vag bleeding otherwise normal exam.  LAWSON, MARIE DARLENE 08/18/2014, 4:29 PM

## 2014-11-23 ENCOUNTER — Emergency Department (HOSPITAL_COMMUNITY)
Admission: EM | Admit: 2014-11-23 | Discharge: 2014-11-23 | Disposition: A | Discharge status: Home / Self Care | Payer: BLUE CROSS/BLUE SHIELD | Attending: Emergency Medicine | Admitting: Emergency Medicine

## 2014-11-23 ENCOUNTER — Emergency Department (HOSPITAL_COMMUNITY): Payer: BLUE CROSS/BLUE SHIELD

## 2014-11-23 ENCOUNTER — Encounter (HOSPITAL_COMMUNITY): Payer: Self-pay

## 2014-11-23 DIAGNOSIS — R109 Unspecified abdominal pain: Secondary | ICD-10-CM

## 2014-11-23 DIAGNOSIS — Z862 Personal history of diseases of the blood and blood-forming organs and certain disorders involving the immune mechanism: Secondary | ICD-10-CM | POA: Insufficient documentation

## 2014-11-23 DIAGNOSIS — R300 Dysuria: Secondary | ICD-10-CM | POA: Diagnosis not present

## 2014-11-23 DIAGNOSIS — Z91040 Latex allergy status: Secondary | ICD-10-CM | POA: Insufficient documentation

## 2014-11-23 DIAGNOSIS — Z3202 Encounter for pregnancy test, result negative: Secondary | ICD-10-CM | POA: Insufficient documentation

## 2014-11-23 DIAGNOSIS — N83202 Unspecified ovarian cyst, left side: Secondary | ICD-10-CM

## 2014-11-23 DIAGNOSIS — N8320 Unspecified ovarian cysts: Secondary | ICD-10-CM | POA: Diagnosis not present

## 2014-11-23 DIAGNOSIS — R1084 Generalized abdominal pain: Secondary | ICD-10-CM | POA: Diagnosis present

## 2014-11-23 LAB — URINALYSIS, ROUTINE W REFLEX MICROSCOPIC
BILIRUBIN URINE: NEGATIVE
GLUCOSE, UA: NEGATIVE mg/dL
HGB URINE DIPSTICK: NEGATIVE
Ketones, ur: NEGATIVE mg/dL
Leukocytes, UA: NEGATIVE
NITRITE: NEGATIVE
Protein, ur: NEGATIVE mg/dL
SPECIFIC GRAVITY, URINE: 1.015 (ref 1.005–1.030)
UROBILINOGEN UA: 0.2 mg/dL (ref 0.0–1.0)
pH: 7 (ref 5.0–8.0)

## 2014-11-23 LAB — CBC WITH DIFFERENTIAL/PLATELET
BASOS PCT: 1 % (ref 0–1)
Basophils Absolute: 0 10*3/uL (ref 0.0–0.1)
Eosinophils Absolute: 0.1 10*3/uL (ref 0.0–0.7)
Eosinophils Relative: 3 % (ref 0–5)
HCT: 34.7 % — ABNORMAL LOW (ref 36.0–46.0)
Hemoglobin: 11.4 g/dL — ABNORMAL LOW (ref 12.0–15.0)
LYMPHS ABS: 2.1 10*3/uL (ref 0.7–4.0)
Lymphocytes Relative: 49 % — ABNORMAL HIGH (ref 12–46)
MCH: 28.2 pg (ref 26.0–34.0)
MCHC: 32.9 g/dL (ref 30.0–36.0)
MCV: 85.9 fL (ref 78.0–100.0)
MONO ABS: 0.3 10*3/uL (ref 0.1–1.0)
Monocytes Relative: 8 % (ref 3–12)
NEUTROS PCT: 39 % — AB (ref 43–77)
Neutro Abs: 1.6 10*3/uL — ABNORMAL LOW (ref 1.7–7.7)
PLATELETS: 170 10*3/uL (ref 150–400)
RBC: 4.04 MIL/uL (ref 3.87–5.11)
RDW: 13.5 % (ref 11.5–15.5)
WBC: 4.2 10*3/uL (ref 4.0–10.5)

## 2014-11-23 LAB — BASIC METABOLIC PANEL
ANION GAP: 6 (ref 5–15)
BUN: 10 mg/dL (ref 6–20)
CALCIUM: 8.8 mg/dL — AB (ref 8.9–10.3)
CO2: 27 mmol/L (ref 22–32)
Chloride: 102 mmol/L (ref 101–111)
Creatinine, Ser: 0.87 mg/dL (ref 0.44–1.00)
GFR calc non Af Amer: >60 mL/min (ref >60)
GLUCOSE: 91 mg/dL (ref 65–99)
POTASSIUM: 3.8 mmol/L (ref 3.5–5.1)
SODIUM: 135 mmol/L (ref 135–145)

## 2014-11-23 LAB — PREGNANCY, URINE: PREG TEST UR: NEGATIVE

## 2014-11-23 MED ORDER — ONDANSETRON HCL 4 MG/2ML IJ SOLN
INTRAMUSCULAR | Status: AC
  Administered 2014-11-23: 4 mg
  Filled 2014-11-23: qty 2

## 2014-11-23 MED ORDER — IOHEXOL 300 MG/ML  SOLN
50.0000 mL | Freq: Once | INTRAMUSCULAR | Status: AC | PRN
  Administered 2014-11-23: 50 mL via ORAL

## 2014-11-23 MED ORDER — NAPROXEN 500 MG PO TABS: 500.0000 mg | ORAL_TABLET | Freq: Two times a day (BID) | ORAL | Status: DC

## 2014-11-23 MED ORDER — ONDANSETRON HCL 4 MG/2ML IJ SOLN
4.0000 mg | Freq: Once | INTRAMUSCULAR | Status: AC
  Administered 2014-11-23: 4 mg via INTRAVENOUS
  Filled 2014-11-23: qty 2

## 2014-11-23 MED ORDER — IOHEXOL 300 MG/ML  SOLN
100.0000 mL | Freq: Once | INTRAMUSCULAR | Status: AC | PRN
Start: 2014-11-23 — End: 2014-11-23
  Administered 2014-11-23: 100 mL via INTRAVENOUS

## 2014-11-23 MED ORDER — SODIUM CHLORIDE 0.9 % IV BOLUS (SEPSIS)
1000.0000 mL | Freq: Once | INTRAVENOUS | Status: AC
  Administered 2014-11-23: 1000 mL via INTRAVENOUS

## 2014-11-23 MED ORDER — FENTANYL CITRATE (PF) 100 MCG/2ML IJ SOLN
50.0000 ug | Freq: Once | INTRAMUSCULAR | Status: AC
  Administered 2014-11-23: 50 ug via INTRAVENOUS
  Filled 2014-11-23: qty 2

## 2014-11-23 MED ORDER — SODIUM CHLORIDE 0.9 % IV SOLN
INTRAVENOUS | Status: DC
  Administered 2014-11-23: 17:00:00 via INTRAVENOUS

## 2014-11-23 MED ORDER — FENTANYL CITRATE (PF) 100 MCG/2ML IJ SOLN
INTRAMUSCULAR | Status: AC
  Administered 2014-11-23: 50 ug
  Filled 2014-11-23: qty 2

## 2014-11-23 NOTE — ED Provider Notes (Signed)
CSN: [id number]     Arrival date & time 11/23/14  1427 History   First MD Initiated Contact with Patient 11/23/14 1445     Chief Complaint  Patient presents with  . Abdominal Pain     (Consider location/radiation/quality/duration/timing/severity/associated sxs/prior Treatment) Patient is a 24 y.o. female presenting with abdominal pain. The history is provided by the patient.  Abdominal Pain Associated symptoms: dysuria and nausea   Associated symptoms: no chest pain, no fever, no hematuria, no shortness of breath, no vaginal bleeding and no vaginal discharge    patient with complaint of generalized abdominal pain for 4 days. Also with some dysuria nausea but no vomiting had 2 loose bowel movements a day. Last measured. Was 2 weeks ago. Pain is stated is generalized also does involve both sides of the back. No vaginal discharge no vaginal bleeding. No fevers. Pain described as crampy in nature sometimes sharp. Patient gave a 7 out of 10 on the pain scale. No history of similar pain.  Past Medical History  Diagnosis Date  . Headache(784.0)   . Anemia    Past Surgical History  Procedure Laterality Date  . Wisdom tooth extraction     Family History  Problem Relation Age of Onset  . Cancer Paternal Aunt   . Other Neg Hx    History  Substance Use Topics  . Smoking status: Never Smoker   . Smokeless tobacco: Not on file  . Alcohol Use: Yes     Comment: occ   OB History    Gravida Para Term Preterm AB TAB SAB Ectopic Multiple Living   3 2 2  1  1   2      Review of Systems  Constitutional: Negative for fever.  HENT: Negative for congestion.   Eyes: Negative for visual disturbance.  Respiratory: Negative for shortness of breath.   Cardiovascular: Negative for chest pain.  Gastrointestinal: Positive for nausea and abdominal pain.  Genitourinary: Positive for dysuria. Negative for hematuria, vaginal bleeding and vaginal discharge.  Musculoskeletal: Positive for back pain.   Neurological: Negative for headaches.  Hematological: Does not bruise/bleed easily.  Psychiatric/Behavioral: Negative for confusion.      Allergies  Latex  Home Medications   Prior to Admission medications   Medication Sig Start Date End Date Taking? Authorizing Provider  HYDROcodone-acetaminophen (NORCO/VICODIN) 5-325 MG per tablet Take 1-2 tablets by mouth every 4 (four) hours as needed. Patient not taking: Reported on 08/18/2014 05/04/13   Linwood Dibbles, MD  naproxen (NAPROSYN) 500 MG tablet Take 1 tablet (500 mg total) by mouth 2 (two) times daily. 11/23/14   Vanetta Mulders, MD   BP 132/71 mmHg  Pulse 82  Temp(Src) 98.1 F (36.7 C) (Oral)  Resp 20  Ht 5\' 9"  (1.753 m)  Wt 174 lb (78.926 kg)  BMI 25.68 kg/m2  SpO2 100%  LMP 11/09/2014 Physical Exam  Constitutional: She is oriented to person, place, and time. She appears well-developed and well-nourished. No distress.  HENT:  Head: Normocephalic and atraumatic.  Mouth/Throat: Oropharynx is clear and moist.  Eyes: Conjunctivae and EOM are normal. Pupils are equal, round, and reactive to light.  Neck: Neck supple.  Cardiovascular: Normal rate, regular rhythm and normal heart sounds.   No murmur heard. Pulmonary/Chest: Effort normal and breath sounds normal. No respiratory distress.  Abdominal: Soft. Bowel sounds are normal. There is no tenderness.  Musculoskeletal: Normal range of motion.  Neurological: She is alert and oriented to person, place, and time. No cranial nerve deficit.  She exhibits normal muscle tone. Coordination normal.  Skin: Skin is warm. No rash noted.  Nursing note and vitals reviewed.   ED Course  Procedures (including critical care time) Labs Review Labs Reviewed  CBC WITH DIFFERENTIAL/PLATELET - Abnormal; Notable for the following:    Hemoglobin 11.4 (*)    HCT 34.7 (*)    Neutrophils Relative % 39 (*)    Neutro Abs 1.6 (*)    Lymphocytes Relative 49 (*)    All other components within normal  limits  BASIC METABOLIC PANEL - Abnormal; Notable for the following:    Calcium 8.8 (*)    All other components within normal limits  URINALYSIS, ROUTINE W REFLEX MICROSCOPIC (NOT AT [facility])  PREGNANCY, URINE   Results for orders placed or performed during the hospital encounter of 11/23/14  Urinalysis, Routine w reflex microscopic (not at [facility])  Result Value Ref Range   Color, Urine YELLOW YELLOW   APPearance CLEAR CLEAR   Specific Gravity, Urine 1.015 1.005 - 1.030   pH 7.0 5.0 - 8.0   Glucose, UA NEGATIVE NEGATIVE mg/dL   Hgb urine dipstick NEGATIVE NEGATIVE   Bilirubin Urine NEGATIVE NEGATIVE   Ketones, ur NEGATIVE NEGATIVE mg/dL   Protein, ur NEGATIVE NEGATIVE mg/dL   Urobilinogen, UA 0.2 0.0 - 1.0 mg/dL   Nitrite NEGATIVE NEGATIVE   Leukocytes, UA NEGATIVE NEGATIVE  Pregnancy, urine  Result Value Ref Range   Preg Test, Ur NEGATIVE NEGATIVE  CBC with Differential  Result Value Ref Range   WBC 4.2 4.0 - 10.5 K/uL   RBC 4.04 3.87 - 5.11 MIL/uL   Hemoglobin 11.4 (L) 12.0 - 15.0 g/dL   HCT 16.1 (L) 09.6 - 04.5 %   MCV 85.9 78.0 - 100.0 fL   MCH 28.2 26.0 - 34.0 pg   MCHC 32.9 30.0 - 36.0 g/dL   RDW 40.9 81.1 - 91.4 %   Platelets 170 150 - 400 K/uL   Neutrophils Relative % 39 (L) 43 - 77 %   Neutro Abs 1.6 (L) 1.7 - 7.7 K/uL   Lymphocytes Relative 49 (H) 12 - 46 %   Lymphs Abs 2.1 0.7 - 4.0 K/uL   Monocytes Relative 8 3 - 12 %   Monocytes Absolute 0.3 0.1 - 1.0 K/uL   Eosinophils Relative 3 0 - 5 %   Eosinophils Absolute 0.1 0.0 - 0.7 K/uL   Basophils Relative 1 0 - 1 %   Basophils Absolute 0.0 0.0 - 0.1 K/uL  Basic metabolic panel  Result Value Ref Range   Sodium 135 135 - 145 mmol/L   Potassium 3.8 3.5 - 5.1 mmol/L   Chloride 102 101 - 111 mmol/L   CO2 27 22 - 32 mmol/L   Glucose, Bld 91 65 - 99 mg/dL   BUN 10 6 - 20 mg/dL   Creatinine, Ser 7.82 0.44 - 1.00 mg/dL   Calcium 8.8 (L) 8.9 - 10.3 mg/dL   GFR calc non Af Amer >60 >60 mL/min   GFR calc Af Amer >60  >60 mL/min   Anion gap 6 5 - 15     Imaging Review Ct Abdomen Pelvis W Contrast  11/23/2014   CLINICAL DATA:  Lower abdominal and back pain for 4 days.  EXAM: CT ABDOMEN AND PELVIS WITH CONTRAST  TECHNIQUE: Multidetector CT imaging of the abdomen and pelvis was performed using the standard protocol following bolus administration of intravenous contrast.  CONTRAST:  50mL OMNIPAQUE IOHEXOL 300 MG/ML SOLN, OMNIPAQUE IOHEXOL 300  MG/ML SOLN  COMPARISON:  CT 05/03/2013  FINDINGS: The included lung bases are clear.  The liver, gallbladder, spleen, pancreas, and adrenal glands are normal. The kidneys demonstrate symmetric enhancement without hydronephrosis or localizing abnormality.  Stomach is physiologically distended. There are no dilated or thickened bowel loops. The appendix is normal. Small volume of stool throughout the colon without colonic wall thickening or localizing abnormality. No free air, free fluid, or intra-abdominal fluid collection.  No retroperitoneal adenopathy. Abdominal aorta is normal in caliber.  Within the pelvis the urinary bladder is minimally distended. The uterus is slightly bulbous in appearance without discrete localizing abnormality. There is a probable collapsing corpus luteal cyst in the left adnexa. No suspicious adnexal mass. Trace free fluid in the pelvis is physiologic.  There are no acute or suspicious osseous abnormalities.  IMPRESSION: Probable involuting corpus luteal cyst in the left ovary, physiologic for age. There is otherwise no acute abnormality in the abdomen/pelvis.   Electronically Signed   By: Rubye Oaks M.D.   On: 11/23/2014 17:35     EKG Interpretation None      MDM   Final diagnoses:  Abdominal pain  Cyst of left ovary    Patient presented for generalized abdominal pain for 4 days. Patient feeling like she was constipated and also pressure on her urinary bladder. Also complaining of a mild headache and feeling fatigued. Reports nausea  but no vomiting. 2 days ago patient took lacks definite several loose bowel movement about 2 a day and since then has had some generalized abdominal pain. Denies any vaginal discharge or any vaginal bleeding. Last measured. Was 2 weeks ago.   Labs without any significant abnormality. Patency test negative urinalysis negative for urinary tract infection. No leukocytosis. No significant anemia.  Patient's CT scan of the abdomen and pelvis showed a left corpus luteum cysts which correlate to her last menstrual period. No evidence of any fluid in the pelvic or abdominal cavity no complicating or significant intra-abdominal pathology.   The patient did have a brief period of a low blood pressure never below 90 systolic the lowest recorded was 93. Patient was already ordered a liter fluid. She received that and blood pressures improved. Patient was ambulated around with no drop in her blood pressure and no symptoms. Patient will be discharged home on Naprosyn close precautions to return for any lightheadedness or feeling like she's got pass out or for any new or worse symptoms.    Vanetta Mulders, MD 11/23/14 [phone]

## 2014-11-23 NOTE — ED Notes (Signed)
MD at bedside at this time.

## 2014-11-23 NOTE — Discharge Instructions (Signed)
Workup for the belly pain without significant findings. There is evidence of a left corpus luteum cyst but doubt that that's causing all the symptoms. Labs without significant abnormalities. Take the Naprosyn as directed. Return for any new or worse symptoms. Patient given precautions that starts feeling lightheaded or feeling like she's got pass out to return right away.

## 2014-11-23 NOTE — ED Notes (Signed)
Pt reports generalized abd pain x 4 days.  Reports constipation and feeling pressure on bladder.  Also c/o headache and feeling fatigue.  Reports nausea, no vomiting.  Pt says took laxative 2 days ago and has had several loose bowel movements since then.  Denies abnormal vaginal bleeding or discharge.

## 2014-11-23 NOTE — ED Notes (Signed)
Patient given Ginger Ale per RN approval. 

## 2014-11-24 ENCOUNTER — Encounter (HOSPITAL_COMMUNITY): Payer: Self-pay | Admitting: Emergency Medicine

## 2014-11-24 ENCOUNTER — Emergency Department (HOSPITAL_COMMUNITY)
Admission: EM | Admit: 2014-11-24 | Discharge: 2014-11-24 | Disposition: A | Discharge status: Home / Self Care | Payer: BLUE CROSS/BLUE SHIELD | Attending: Emergency Medicine | Admitting: Emergency Medicine

## 2014-11-24 ENCOUNTER — Emergency Department (HOSPITAL_COMMUNITY): Payer: BLUE CROSS/BLUE SHIELD

## 2014-11-24 DIAGNOSIS — N760 Acute vaginitis: Secondary | ICD-10-CM | POA: Insufficient documentation

## 2014-11-24 DIAGNOSIS — Z862 Personal history of diseases of the blood and blood-forming organs and certain disorders involving the immune mechanism: Secondary | ICD-10-CM | POA: Insufficient documentation

## 2014-11-24 DIAGNOSIS — R102 Pelvic and perineal pain: Secondary | ICD-10-CM

## 2014-11-24 DIAGNOSIS — Z91040 Latex allergy status: Secondary | ICD-10-CM | POA: Insufficient documentation

## 2014-11-24 DIAGNOSIS — B9689 Other specified bacterial agents as the cause of diseases classified elsewhere: Secondary | ICD-10-CM

## 2014-11-24 DIAGNOSIS — R1032 Left lower quadrant pain: Secondary | ICD-10-CM | POA: Insufficient documentation

## 2014-11-24 DIAGNOSIS — M549 Dorsalgia, unspecified: Secondary | ICD-10-CM | POA: Diagnosis present

## 2014-11-24 LAB — URINALYSIS, ROUTINE W REFLEX MICROSCOPIC
Bilirubin Urine: NEGATIVE
GLUCOSE, UA: NEGATIVE mg/dL
HGB URINE DIPSTICK: NEGATIVE
Ketones, ur: NEGATIVE mg/dL
Leukocytes, UA: NEGATIVE
NITRITE: NEGATIVE
PH: 6 (ref 5.0–8.0)
Protein, ur: NEGATIVE mg/dL
Specific Gravity, Urine: 1.01 (ref 1.005–1.030)
Urobilinogen, UA: 0.2 mg/dL (ref 0.0–1.0)

## 2014-11-24 LAB — WET PREP, GENITAL
TRICH WET PREP: NONE SEEN
Yeast Wet Prep HPF POC: NONE SEEN

## 2014-11-24 MED ORDER — HYDROCODONE-ACETAMINOPHEN 5-325 MG PO TABS: 1.0000 | ORAL_TABLET | ORAL | Status: DC | PRN

## 2014-11-24 MED ORDER — AMMONIA AROMATIC IN INHA
RESPIRATORY_TRACT | Status: AC
  Filled 2014-11-24: qty 10

## 2014-11-24 MED ORDER — IBUPROFEN 800 MG PO TABS
800.0000 mg | ORAL_TABLET | Freq: Once | ORAL | Status: AC
  Administered 2014-11-24: 800 mg via ORAL
  Filled 2014-11-24: qty 1

## 2014-11-24 MED ORDER — METRONIDAZOLE 500 MG PO TABS: 500.0000 mg | ORAL_TABLET | Freq: Two times a day (BID) | ORAL | Status: DC

## 2014-11-24 MED ORDER — OXYCODONE-ACETAMINOPHEN 5-325 MG PO TABS
1.0000 | ORAL_TABLET | Freq: Once | ORAL | Status: AC
  Administered 2014-11-24: 1 via ORAL
  Filled 2014-11-24: qty 1

## 2014-11-24 MED ORDER — METRONIDAZOLE 500 MG PO TABS
500.0000 mg | ORAL_TABLET | Freq: Once | ORAL | Status: AC
  Administered 2014-11-24: 500 mg via ORAL
  Filled 2014-11-24: qty 1

## 2014-11-24 MED ORDER — HYDROCODONE-ACETAMINOPHEN 5-325 MG PO TABS
1.0000 | ORAL_TABLET | Freq: Once | ORAL | Status: AC
  Administered 2014-11-24: 1 via ORAL
  Filled 2014-11-24: qty 1

## 2014-11-24 NOTE — ED Notes (Signed)
[facility] arrived to transport PT

## 2014-11-24 NOTE — ED Provider Notes (Signed)
CSN: [id number]     Arrival date & time 11/24/14  7616 History   First MD Initiated Contact with Patient 11/24/14 [phone]     Chief Complaint  Patient presents with  . Back Pain     (Consider location/radiation/quality/duration/timing/severity/associated sxs/prior Treatment) The history is provided by the patient.   Tracey Whitney is a 24 y.o. female with no significant past medical history presenting for reevaluation of low abdominal pain which is been present for the past 5 days.  She describes constant pain which is sharp and worsened with certain positions and movement, improves when she is in the fetal position.  Her pain radiates into her back bilaterally but also is greatest in the left side.  She has had no fevers, nausea, vomiting.  She reports having bladder pressure sensation and having to urinate frequently.  She denies hematuria.  She thought she had a UTI last weekend and took a ciprofloxacin tablet 1 and drink lots of fluid and her symptoms improved briefly.  She was seen here yesterday at which time lab tests were unremarkable, she had a CT scan showing a probable involuting left corpus luteum cyst but otherwise normal study.  She reports persistent pain which is not improved since yesterday's visit.  She is taking Naprosyn without relief.  She denies vaginal discharge, she is sexually active, LMP 11/09/2014.  Her urine pregnancy test yesterday was negative.     Past Medical History  Diagnosis Date  . Headache(784.0)   . Anemia    Past Surgical History  Procedure Laterality Date  . Wisdom tooth extraction     Family History  Problem Relation Age of Onset  . Cancer Paternal Aunt   . Other Neg Hx    History  Substance Use Topics  . Smoking status: Never Smoker   . Smokeless tobacco: Not on file  . Alcohol Use: Yes     Comment: occ   OB History    Gravida Para Term Preterm AB TAB SAB Ectopic Multiple Living   3 2 2  1  1   2      Review of Systems   Constitutional: Negative for fever.  HENT: Negative for congestion and sore throat.   Eyes: Negative.   Respiratory: Negative for chest tightness and shortness of breath.   Cardiovascular: Negative for chest pain.  Gastrointestinal: Negative for nausea and abdominal pain.  Genitourinary: Negative.   Musculoskeletal: Negative for joint swelling, arthralgias and neck pain.  Skin: Negative.  Negative for rash and wound.  Neurological: Negative for dizziness, weakness, light-headedness, numbness and headaches.  Psychiatric/Behavioral: Negative.       Allergies  Latex  Home Medications   Prior to Admission medications   Medication Sig Start Date End Date Taking? Authorizing Provider  HYDROcodone-acetaminophen (NORCO/VICODIN) 5-325 MG per tablet Take 1 tablet by mouth every 4 (four) hours as needed. 11/24/14   Burgess Amor, PA-C  metroNIDAZOLE (FLAGYL) 500 MG tablet Take 1 tablet (500 mg total) by mouth 2 (two) times daily. 11/24/14   Burgess Amor, PA-C  naproxen (NAPROSYN) 500 MG tablet Take 1 tablet (500 mg total) by mouth 2 (two) times daily. Patient not taking: Reported on 11/24/2014 11/23/14   Vanetta Mulders, MD   BP 93/61 mmHg  Pulse 54  Temp(Src) 98.5 F (36.9 C) (Oral)  Resp 16  Ht 5\' 10"  (1.778 m)  Wt 174 lb (78.926 kg)  BMI 24.97 kg/m2  SpO2 100%  LMP 11/09/2014 Physical Exam  Constitutional: She appears well-developed and  well-nourished.  HENT:  Head: Normocephalic and atraumatic.  Eyes: Conjunctivae are normal.  Neck: Normal range of motion.  Cardiovascular: Normal rate, regular rhythm, normal heart sounds and intact distal pulses.   Pulmonary/Chest: Effort normal and breath sounds normal. She has no wheezes.  Abdominal: Soft. Bowel sounds are normal. There is tenderness in the left lower quadrant. There is no rigidity, no rebound and no guarding.  Genitourinary: Uterus normal. Uterus is not tender. Cervix exhibits no motion tenderness and no discharge. Left adnexum  displays tenderness. Left adnexum displays no mass. Vaginal discharge found.  Thick white vaginal dc.  Musculoskeletal: Normal range of motion.  Neurological: She is alert.  Skin: Skin is warm and dry.  Psychiatric: She has a normal mood and affect.  Nursing note and vitals reviewed.   ED Course  Procedures (including critical care time) Labs Review Labs Reviewed  WET PREP, GENITAL - Abnormal; Notable for the following:    Clue Cells Wet Prep HPF POC MANY (*)    WBC, Wet Prep HPF POC MANY (*)    All other components within normal limits  URINALYSIS, ROUTINE W REFLEX MICROSCOPIC (NOT AT [facility])  GC/CHLAMYDIA PROBE AMP () NOT AT [facility]    Imaging Review US Transvaginal Non-ob  11/24/2014   CLINICAL DATA:  Subsequent encounter for five day history of left lower quadrant and lower back pain with urinary frequency.  EXAM: TRANSABDOMINAL AND TRANSVAGINAL ULTRASOUND OF PELVIS  TECHNIQUE: Both transabdominal and transvaginal ultrasound examinations of the pelvis were performed. Transabdominal technique was performed for global imaging of the pelvis including uterus, ovaries, adnexal regions, and pelvic cul-de-sac. It was necessary to proceed with endovaginal exam following the transabdominal exam to visualize the ovaries.  COMPARISON:  CT scan from yesterday  FINDINGS: Uterus  Measurements: 10.2 x 4.9 x 6.8 cm. No fibroids or other mass visualized.  Endometrium  Thickness: 11 mm.  No focal abnormality visualized.  Right ovary  Measurements: 2.8 x 1.7 x 2.9 cm. Normal appearance/no adnexal mass.  Left ovary  Measurements: 3.9 x 1.8 x 3.4 cm. Normal appearance/no adnexal mass.  Other findings  Trace free fluid noted.  IMPRESSION: Normal pelvic ultrasound.   Electronically Signed   By: Kennith Center M.D.   On: 11/24/2014 12:17   US Pelvis Complete  11/24/2014   CLINICAL DATA:  Subsequent encounter for five day history of left lower quadrant and lower back pain with urinary frequency.  EXAM:  TRANSABDOMINAL AND TRANSVAGINAL ULTRASOUND OF PELVIS  TECHNIQUE: Both transabdominal and transvaginal ultrasound examinations of the pelvis were performed. Transabdominal technique was performed for global imaging of the pelvis including uterus, ovaries, adnexal regions, and pelvic cul-de-sac. It was necessary to proceed with endovaginal exam following the transabdominal exam to visualize the ovaries.  COMPARISON:  CT scan from yesterday  FINDINGS: Uterus  Measurements: 10.2 x 4.9 x 6.8 cm. No fibroids or other mass visualized.  Endometrium  Thickness: 11 mm.  No focal abnormality visualized.  Right ovary  Measurements: 2.8 x 1.7 x 2.9 cm. Normal appearance/no adnexal mass.  Left ovary  Measurements: 3.9 x 1.8 x 3.4 cm. Normal appearance/no adnexal mass.  Other findings  Trace free fluid noted.  IMPRESSION: Normal pelvic ultrasound.   Electronically Signed   By: Kennith Center M.D.   On: 11/24/2014 12:17   Ct Abdomen Pelvis W Contrast  11/23/2014   CLINICAL DATA:  Lower abdominal and back pain for 4 days.  EXAM: CT ABDOMEN AND PELVIS WITH CONTRAST  TECHNIQUE:  Multidetector CT imaging of the abdomen and pelvis was performed using the standard protocol following bolus administration of intravenous contrast.  CONTRAST:  50mL OMNIPAQUE IOHEXOL 300 MG/ML SOLN, OMNIPAQUE IOHEXOL 300 MG/ML SOLN  COMPARISON:  CT 05/03/2013  FINDINGS: The included lung bases are clear.  The liver, gallbladder, spleen, pancreas, and adrenal glands are normal. The kidneys demonstrate symmetric enhancement without hydronephrosis or localizing abnormality.  Stomach is physiologically distended. There are no dilated or thickened bowel loops. The appendix is normal. Small volume of stool throughout the colon without colonic wall thickening or localizing abnormality. No free air, free fluid, or intra-abdominal fluid collection.  No retroperitoneal adenopathy. Abdominal aorta is normal in caliber.  Within the pelvis the urinary bladder is  minimally distended. The uterus is slightly bulbous in appearance without discrete localizing abnormality. There is a probable collapsing corpus luteal cyst in the left adnexa. No suspicious adnexal mass. Trace free fluid in the pelvis is physiologic.  There are no acute or suspicious osseous abnormalities.  IMPRESSION: Probable involuting corpus luteal cyst in the left ovary, physiologic for age. There is otherwise no acute abnormality in the abdomen/pelvis.   Electronically Signed   By: Rubye Oaks M.D.   On: 11/23/2014 17:35     EKG Interpretation None      MDM   Final diagnoses:  Bacterial vaginosis  Pelvic pain in female    Patients labs and/or radiological studies were reviewed and considered during the medical decision making and disposition process.  Results were also discussed with patient. Ct scan from yesterdays visit reviewed, [place] today negative for cyst, TOA or ovarian mass.  Pt with pain of unclear etiology, but is positive for bacterial vaginosis.  Doubt this as the source of pain and discussed this with pt.  Flagyl prescribed.  Advised gc/chlamydia cx pending.  Pelvic exam without suggestion of PID, offered prophylactic tx, pt defers and will wait for cx results.  She was advised to f/u with her pcp for a recheck this week if pain persists or worsen.     Burgess Amor, PA-C 11/25/14 [phone]  Bethann Berkshire, MD 11/25/14 [phone]

## 2014-11-24 NOTE — ED Notes (Signed)
Pt reports lower back pain, urinary frequency x5 days.

## 2014-11-24 NOTE — ED Notes (Signed)
Patient given discharge instruction, verbalized understand. Patient ambulatory out of the department.  

## 2014-11-24 NOTE — ED Notes (Signed)
Pelvic cart set up 

## 2014-11-24 NOTE — Discharge Instructions (Signed)
Bacterial Vaginosis Bacterial vaginosis is a vaginal infection that occurs when the normal balance of bacteria in the vagina is disrupted. It results from an overgrowth of certain bacteria. This is the most common vaginal infection in women of childbearing age. Treatment is important to prevent complications, especially in pregnant women, as it can cause a premature delivery. CAUSES  Bacterial vaginosis is caused by an increase in harmful bacteria that are normally present in smaller amounts in the vagina. Several different kinds of bacteria can cause bacterial vaginosis. However, the reason that the condition develops is not fully understood. RISK FACTORS Certain activities or behaviors can put you at an increased risk of developing bacterial vaginosis, including:  Having a new sex partner or multiple sex partners.  Douching.  Using an intrauterine device (IUD) for contraception. Women do not get bacterial vaginosis from toilet seats, bedding, swimming pools, or contact with objects around them. SIGNS AND SYMPTOMS  Some women with bacterial vaginosis have no signs or symptoms. Common symptoms include:  Grey vaginal discharge.  A fishlike odor with discharge, especially after sexual intercourse.  Itching or burning of the vagina and vulva.  Burning or pain with urination. DIAGNOSIS  Your health care provider will take a medical history and examine the vagina for signs of bacterial vaginosis. A sample of vaginal fluid may be taken. Your health care provider will look at this sample under a microscope to check for bacteria and abnormal cells. A vaginal pH test may also be done.  TREATMENT  Bacterial vaginosis may be treated with antibiotic medicines. These may be given in the form of a pill or a vaginal cream. A second round of antibiotics may be prescribed if the condition comes back after treatment.  HOME CARE INSTRUCTIONS   Only take over-the-counter or prescription medicines as  directed by your health care provider.  If antibiotic medicine was prescribed, take it as directed. Make sure you finish it even if you start to feel better.  Do not have sex until treatment is completed.  Tell all sexual partners that you have a vaginal infection. They should see their health care provider and be treated if they have problems, such as a mild rash or itching.  Practice safe sex by using condoms and only having one sex partner. SEEK MEDICAL CARE IF:   Your symptoms are not improving after 3 days of treatment.  You have increased discharge or pain.  You have a fever. MAKE SURE YOU:   Understand these instructions.  Will watch your condition.  Will get help right away if you are not doing well or get worse. FOR MORE INFORMATION  Centers for Disease Control and Prevention, Division of STD Prevention: www.cdc.gov/std American Sexual Health Association (ASHA): www.ashastd.org  Document Released: 05/16/2005 Document Revised: 03/06/2013 Document Reviewed: 12/26/2012 ExitCare Patient Information 2015 [employer]. This information is not intended to replace advice given to you by your health care provider. Make sure you discuss any questions you have with your health care provider.  

## 2014-11-25 LAB — GC/CHLAMYDIA PROBE AMP (~~LOC~~) NOT AT ARMC
Chlamydia: NEGATIVE
Neisseria Gonorrhea: NEGATIVE

## 2015-07-10 ENCOUNTER — Inpatient Hospital Stay (HOSPITAL_COMMUNITY): Payer: Self-pay

## 2015-07-10 ENCOUNTER — Encounter (HOSPITAL_COMMUNITY): Payer: Self-pay | Admitting: *Deleted

## 2015-07-10 ENCOUNTER — Inpatient Hospital Stay (HOSPITAL_COMMUNITY)
Admission: AD | Admit: 2015-07-10 | Discharge: 2015-07-10 | Disposition: A | Discharge status: Home / Self Care | Payer: Self-pay | Source: Ambulatory Visit | Attending: Obstetrics & Gynecology | Admitting: Obstetrics & Gynecology

## 2015-07-10 DIAGNOSIS — R102 Pelvic and perineal pain: Secondary | ICD-10-CM | POA: Insufficient documentation

## 2015-07-10 DIAGNOSIS — E282 Polycystic ovarian syndrome: Secondary | ICD-10-CM | POA: Insufficient documentation

## 2015-07-10 LAB — URINALYSIS, ROUTINE W REFLEX MICROSCOPIC
Bilirubin Urine: NEGATIVE
Glucose, UA: NEGATIVE mg/dL
Ketones, ur: NEGATIVE mg/dL
Leukocytes, UA: NEGATIVE
NITRITE: NEGATIVE
PH: 6 (ref 5.0–8.0)
Protein, ur: NEGATIVE mg/dL
SPECIFIC GRAVITY, URINE: 1.015 (ref 1.005–1.030)

## 2015-07-10 LAB — CBC
HCT: 31.8 % — ABNORMAL LOW (ref 36.0–46.0)
HEMOGLOBIN: 10.8 g/dL — AB (ref 12.0–15.0)
MCH: 29.3 pg (ref 26.0–34.0)
MCHC: 34 g/dL (ref 30.0–36.0)
MCV: 86.4 fL (ref 78.0–100.0)
Platelets: 155 10*3/uL (ref 150–400)
RBC: 3.68 MIL/uL — AB (ref 3.87–5.11)
RDW: 12.8 % (ref 11.5–15.5)
WBC: 4.5 10*3/uL (ref 4.0–10.5)

## 2015-07-10 LAB — RPR: RPR: NONREACTIVE

## 2015-07-10 LAB — URINE MICROSCOPIC-ADD ON

## 2015-07-10 LAB — WET PREP, GENITAL
SPERM: NONE SEEN
TRICH WET PREP: NONE SEEN
Yeast Wet Prep HPF POC: NONE SEEN

## 2015-07-10 LAB — GC/CHLAMYDIA PROBE AMP (~~LOC~~) NOT AT ARMC
Chlamydia: NEGATIVE
Neisseria Gonorrhea: NEGATIVE

## 2015-07-10 LAB — POCT PREGNANCY, URINE: PREG TEST UR: NEGATIVE

## 2015-07-10 LAB — HIV ANTIBODY (ROUTINE TESTING W REFLEX): HIV SCREEN 4TH GENERATION: NONREACTIVE

## 2015-07-10 MED ORDER — KETOROLAC TROMETHAMINE 60 MG/2ML IM SOLN
60.0000 mg | Freq: Once | INTRAMUSCULAR | Status: AC
  Administered 2015-07-10: 60 mg via INTRAMUSCULAR
  Filled 2015-07-10: qty 2

## 2015-07-10 MED ORDER — PROMETHAZINE HCL 25 MG/ML IJ SOLN
25.0000 mg | Freq: Once | INTRAMUSCULAR | Status: AC
  Administered 2015-07-10: 25 mg via INTRAMUSCULAR
  Filled 2015-07-10: qty 1

## 2015-07-10 NOTE — MAU Provider Note (Signed)
History     CSN: [id number]  Arrival date and time: 07/10/15 0125   First Provider Initiated Contact with Patient 07/10/15 [phone]      Chief Complaint  Patient presents with  . Abdominal Pain   HPI Comments: Tracey Whitney is a 25 y.o. [id number] who presents today with LLQ pain. She states that she has had this pain off and on for about 5 months. She saw her PCP at the end of January, and had an [place]. She states that the current "working diagnosis is PCOS". She has an appointment with a GYN on 07/13/15. She states that she could not wait until Monday. She has been taking ibuprofen, and it is not helping the pain.   Pelvic Pain The patient's primary symptoms include pelvic pain. This is a new problem. The current episode started more than 1 month ago (about 5 months ago). The problem occurs intermittently. The problem has been gradually worsening. Pain severity now: 10/10. The problem affects the left side. She is not pregnant. Associated symptoms include abdominal pain and nausea. Pertinent negatives include no chills, constipation, diarrhea, dysuria, fever, frequency, urgency or vomiting. Nothing aggravates the symptoms. She has tried NSAIDs for the symptoms. The treatment provided no relief. She is sexually active. It is unknown whether or not her partner has an STD. She uses nothing for contraception. Menstrual history: LMP: 07/06/15, but lighter than normal.     Past Medical History  Diagnosis Date  . Headache(784.0)   . Anemia     Past Surgical History  Procedure Laterality Date  . Wisdom tooth extraction      Family History  Problem Relation Age of Onset  . Cancer Paternal Aunt   . Other Neg Hx     Social History  Substance Use Topics  . Smoking status: Never Smoker   . Smokeless tobacco: None  . Alcohol Use: Yes     Comment: occ    Allergies:  Allergies  Allergen Reactions  . Latex Itching    Prescriptions prior to admission  Medication Sig Dispense Refill Last  Dose  . HYDROcodone-acetaminophen (NORCO/VICODIN) 5-325 MG per tablet Take 1 tablet by mouth every 4 (four) hours as needed. 10 tablet 0   . metroNIDAZOLE (FLAGYL) 500 MG tablet Take 1 tablet (500 mg total) by mouth 2 (two) times daily. 14 tablet 0   . naproxen (NAPROSYN) 500 MG tablet Take 1 tablet (500 mg total) by mouth 2 (two) times daily. (Patient not taking: Reported on 11/24/2014) 14 tablet 0 Not Taking at Unknown time    Review of Systems  Constitutional: Negative for fever and chills.  Gastrointestinal: Positive for nausea and abdominal pain. Negative for vomiting, diarrhea and constipation.  Genitourinary: Positive for pelvic pain. Negative for dysuria, urgency and frequency.   Physical Exam   Blood pressure 117/67, pulse 57, temperature 98.8 F (37.1 C), temperature source Oral, resp. rate 18, height  (1.753 m), weight 80.74 kg (178 lb), last menstrual period 07/06/2015, SpO2 100 %.  Physical Exam  Nursing note and vitals reviewed. Constitutional: She is oriented to person, place, and time. She appears well-developed and well-nourished. No distress.  HENT:  Head: Normocephalic.  Cardiovascular: Normal rate.   Respiratory: Effort normal.  GI: Soft. There is no tenderness. There is no rebound.  Musculoskeletal: Normal range of motion.  Neurological: She is alert and oriented to person, place, and time.  Skin: Skin is warm and dry.  Psychiatric: She has a normal mood and affect.  Results for orders placed or performed during the hospital encounter of 07/10/15 (from the past 24 hour(s))  Urinalysis, Routine w reflex microscopic (not at [facility])     Status: Abnormal   Collection Time: 07/10/15  1:25 AM  Result Value Ref Range   Color, Urine YELLOW YELLOW   APPearance CLEAR CLEAR   Specific Gravity, Urine 1.015 1.005 - 1.030   pH 6.0 5.0 - 8.0   Glucose, UA NEGATIVE NEGATIVE mg/dL   Hgb urine dipstick SMALL (A) NEGATIVE   Bilirubin Urine NEGATIVE NEGATIVE   Ketones, ur  NEGATIVE NEGATIVE mg/dL   Protein, ur NEGATIVE NEGATIVE mg/dL   Nitrite NEGATIVE NEGATIVE   Leukocytes, UA NEGATIVE NEGATIVE  Urine microscopic-add on     Status: Abnormal   Collection Time: 07/10/15  1:25 AM  Result Value Ref Range   Squamous Epithelial / LPF 0-5 (A) NONE SEEN   WBC, UA 0-5 0 - 5 WBC/hpf   RBC / HPF 0-5 0 - 5 RBC/hpf   Bacteria, UA RARE (A) NONE SEEN  Wet prep, genital     Status: Abnormal   Collection Time: 07/10/15  1:59 AM  Result Value Ref Range   Yeast Wet Prep HPF POC NONE SEEN NONE SEEN   Trich, Wet Prep NONE SEEN NONE SEEN   Clue Cells Wet Prep HPF POC PRESENT (A) NONE SEEN   WBC, Wet Prep HPF POC FEW (A) NONE SEEN   Sperm NONE SEEN   Pregnancy, urine POC     Status: None   Collection Time: 07/10/15  2:01 AM  Result Value Ref Range   Preg Test, Ur NEGATIVE NEGATIVE  CBC     Status: Abnormal   Collection Time: 07/10/15  2:06 AM  Result Value Ref Range   WBC 4.5 4.0 - 10.5 K/uL   RBC 3.68 (L) 3.87 - 5.11 MIL/uL   Hemoglobin 10.8 (L) 12.0 - 15.0 g/dL   HCT 40.9 (L) 81.1 - 91.4 %   MCV 86.4 78.0 - 100.0 fL   MCH 29.3 26.0 - 34.0 pg   MCHC 34.0 30.0 - 36.0 g/dL   RDW 78.2 95.6 - 21.3 %   Platelets 155 150 - 400 K/uL   US Transvaginal Non-ob  07/10/2015  CLINICAL DATA:  25 year old female with left lower abdominal pain EXAM: TRANSABDOMINAL AND TRANSVAGINAL ULTRASOUND OF PELVIS TECHNIQUE: Both transabdominal and transvaginal ultrasound examinations of the pelvis were performed. Transabdominal technique was performed for global imaging of the pelvis including uterus, ovaries, adnexal regions, and pelvic cul-de-sac. It was necessary to proceed with endovaginal exam following the transabdominal exam to visualize the endometrium and the ovaries. COMPARISON:  Pelvic ultrasound dated 11/24/2014 FINDINGS: Uterus The uterus is anteverted and measures 9.7 x 5.2 x 6.0 cm. No fibroids or other mass visualized. Endometrium Thickness: 12 mm. No focal abnormality visualized.  Trace amount of fluid noted within the upper endometrial canal. Right ovary Measurements: 3.6 x 1.8 x 2.1 cm. Normal appearance/no adnexal mass. Left ovary Measurements: 3.1 x 1.2 x 2.1 cm. Normal appearance/no adnexal mass. Other findings No abnormal free fluid. IMPRESSION: Unremarkable pelvic ultrasound. Electronically Signed   By: Elgie Collard M.D.   On: 07/10/2015 03:03   US Pelvis Complete  07/10/2015  CLINICAL DATA:  25 year old female with left lower abdominal pain EXAM: TRANSABDOMINAL AND TRANSVAGINAL ULTRASOUND OF PELVIS TECHNIQUE: Both transabdominal and transvaginal ultrasound examinations of the pelvis were performed. Transabdominal technique was performed for global imaging of the pelvis including uterus, ovaries, adnexal regions, and pelvic  cul-de-sac. It was necessary to proceed with endovaginal exam following the transabdominal exam to visualize the endometrium and the ovaries. COMPARISON:  Pelvic ultrasound dated 11/24/2014 FINDINGS: Uterus The uterus is anteverted and measures 9.7 x 5.2 x 6.0 cm. No fibroids or other mass visualized. Endometrium Thickness: 12 mm. No focal abnormality visualized. Trace amount of fluid noted within the upper endometrial canal. Right ovary Measurements: 3.6 x 1.8 x 2.1 cm. Normal appearance/no adnexal mass. Left ovary Measurements: 3.1 x 1.2 x 2.1 cm. Normal appearance/no adnexal mass. Other findings No abnormal free fluid. IMPRESSION: Unremarkable pelvic ultrasound. Electronically Signed   By: Elgie Collard M.D.   On: 07/10/2015 03:03    MAU Course  Procedures  MDM   Assessment and Plan   1. Pelvic pain in female    DC home Comfort measures reviewed  RX: none  Return to MAU as needed FU with OB as planned  Follow-up Information    Please follow up.   Why:  As scheduled   Contact information:   Your OBGYN         Tawnya Crook 07/10/2015, 2:15 AM

## 2015-07-10 NOTE — MAU Note (Signed)
Pt states that she has been in pain on her left lower abdomen for 5 months. It has "gotten stronger and more often and now the pain in unbearable" Pt was told by her PCP that she had small cysts bilaterally last month. Pt is supposed to be seen by an OBGYN on Monday to see if she has PCOS. Pain 10/10, despire Aleve.

## 2015-07-10 NOTE — Discharge Instructions (Signed)

## 2015-07-10 NOTE — MAU Note (Signed)
Pt reports left lower abd off/on for 5 months, states she was seen in her doctor's office last week and was scheduled diarrhea off/on.

## 2015-08-17 DIAGNOSIS — Z79899 Other long term (current) drug therapy: Secondary | ICD-10-CM | POA: Insufficient documentation

## 2015-08-17 DIAGNOSIS — J029 Acute pharyngitis, unspecified: Secondary | ICD-10-CM | POA: Insufficient documentation

## 2015-08-18 ENCOUNTER — Emergency Department (HOSPITAL_COMMUNITY)
Admission: EM | Admit: 2015-08-18 | Discharge: 2015-08-18 | Disposition: A | Discharge status: Home / Self Care | Payer: BLUE CROSS/BLUE SHIELD | Attending: Emergency Medicine | Admitting: Emergency Medicine

## 2015-08-18 ENCOUNTER — Encounter (HOSPITAL_COMMUNITY): Payer: Self-pay | Admitting: Emergency Medicine

## 2015-08-18 DIAGNOSIS — B9789 Other viral agents as the cause of diseases classified elsewhere: Secondary | ICD-10-CM

## 2015-08-18 DIAGNOSIS — J028 Acute pharyngitis due to other specified organisms: Secondary | ICD-10-CM

## 2015-08-18 LAB — RAPID STREP SCREEN (MED CTR MEBANE ONLY): STREPTOCOCCUS, GROUP A SCREEN (DIRECT): NEGATIVE

## 2015-08-18 MED ORDER — IBUPROFEN 100 MG/5ML PO SUSP: 600.0000 mg | Freq: Once | ORAL | Status: DC

## 2015-08-18 MED ORDER — ACETAMINOPHEN 160 MG/5ML PO SOLN: 650.0000 mg | Freq: Once | ORAL | Status: DC

## 2015-08-18 NOTE — ED Notes (Signed)
Patient has had sore throat since Friday, believes she has strep throat.

## 2015-08-18 NOTE — ED Provider Notes (Signed)
CSN: [id number]     Arrival date & time 08/17/15  2323 History   First MD Initiated Contact with Patient 08/18/15 0340     Chief Complaint  Patient presents with  . Sore Throat     (Consider location/radiation/quality/duration/timing/severity/associated sxs/prior Treatment) HPI patient states she started getting a sore throat on March 17. She thinks she may have had a fever the first day or so. She has had body aches and is been taking Motrin which helps the body aches. She has had a mild cough today. She denies rhinorrhea, nausea, vomiting, or diarrhea. She states she has trouble eating because it's painful to swallow.   PCP Dr Jethro Poling  Past Medical History  Diagnosis Date  . Headache(784.0)   . Anemia    Past Surgical History  Procedure Laterality Date  . Wisdom tooth extraction     Family History  Problem Relation Age of Onset  . Cancer Paternal Aunt   . Other Neg Hx    Social History  Substance Use Topics  . Smoking status: Never Smoker   . Smokeless tobacco: None  . Alcohol Use: Yes     Comment: occ   Employed as a Associate Professor  OB History    Gravida Para Term Preterm AB TAB SAB Ectopic Multiple Living   Review of Systems  All other systems reviewed and are negative.     Allergies  Latex  Home Medications   Prior to Admission medications   Medication Sig Start Date End Date Taking? Authorizing Provider  ferrous sulfate 325 (65 FE) MG tablet Take 325 mg by mouth daily with breakfast.   Yes Historical Provider, MD  HYDROcodone-acetaminophen (NORCO/VICODIN) 5-325 MG per tablet Take 1 tablet by mouth every 4 (four) hours as needed. 11/24/14   Burgess Amor, PA-C  naproxen (NAPROSYN) 500 MG tablet Take 1 tablet (500 mg total) by mouth 2 (two) times daily. Patient not taking: Reported on 11/24/2014 11/23/14   Vanetta Mulders, MD   BP 93/75 mmHg  Pulse 90  Temp(Src) 99.4 F (37.4 C) (Temporal)  Resp 18  Ht  (1.753 m)  Wt 174 lb  (78.926 kg)  BMI 25.68 kg/m2  SpO2 100%  LMP 08/04/2015  Vital signs normal   Physical Exam  Constitutional: She is oriented to person, place, and time. She appears well-developed and well-nourished.  Non-toxic appearance. She does not appear ill. No distress.  HENT:  Head: Normocephalic and atraumatic.  Right Ear: External ear normal.  Left Ear: External ear normal.  Nose: Nose normal. No mucosal edema or rhinorrhea.  Mouth/Throat: Oropharynx is clear and moist and mucous membranes are normal. No dental abscesses or uvula swelling.  Voice normal, no drooling, no trismus. Minimally red tonsils without enlargement, no exudates.   Eyes: Conjunctivae and EOM are normal. Pupils are equal, round, and reactive to light.  Neck: Normal range of motion and full passive range of motion without pain. Neck supple.  Pulmonary/Chest: Effort normal. No respiratory distress. She has no rhonchi. She exhibits no crepitus.  Abdominal: Normal appearance.  Musculoskeletal: Normal range of motion.  Moves all extremities well.   Lymphadenopathy:    She has no cervical adenopathy.  Neurological: She is alert and oriented to person, place, and time. She has normal strength. No cranial nerve deficit.  Skin: Skin is warm, dry and intact. No rash noted. No erythema. No pallor.  Psychiatric: She has a  normal mood and affect. Her speech is normal and behavior is normal. Her mood appears not anxious.  Nursing note and vitals reviewed.   ED Course  Procedures (including critical care time)  Medications  ibuprofen (ADVIL,MOTRIN) 100 MG/5ML suspension 600 mg (not administered)  acetaminophen (TYLENOL) solution 650 mg (not administered)   Patient's initial rapid strep is negative, she has no significant cervical lymphadenopathy to suggest mono. She was advised on taking children's liquid Motrin and acetaminophen for her pain. She should drink plenty of cold liquids which would help numb her throat. She can take  over-the-counter sore throat lozenges. She should be rechecked if she is getting worse instead of better.   Labs Review Results for orders placed or performed during the hospital encounter of 08/18/15  Rapid strep screen  Result Value Ref Range   Streptococcus, Group A Screen (Direct) NEGATIVE NEGATIVE   Laboratory interpretation all normal       MDM   Final diagnoses:  Sore throat (viral)    Plan discharge  Devoria AlbeIva Najae Rathert, MD, Concha PyoFACEP     Poppy Mcafee, MD 08/18/15 [phone]

## 2015-08-18 NOTE — Discharge Instructions (Signed)
You can take ibuprofen 600 mg and/or acetaminophen 1000 mg every 6 hrs for pain. You can use the children's liquid if you are having trouble with the pills. Drink plenty of cold liquids and cold food such as ice cream which will help numb your throat. Recheck if you get a high fever, are unable to swallow or have difficulty breathing.  Sore Throat A sore throat is pain, burning, irritation, or scratchiness of the throat. There is often pain or tenderness when swallowing or talking. A sore throat may be accompanied by other symptoms, such as coughing, sneezing, fever, and swollen neck glands. A sore throat is often the first sign of another sickness, such as a cold, flu, strep throat, or mononucleosis (commonly known as mono). Most sore throats go away without medical treatment. CAUSES  The most common causes of a sore throat include:  A viral infection, such as a cold, flu, or mono.  A bacterial infection, such as strep throat, tonsillitis, or whooping cough.  Seasonal allergies.  Dryness in the air.  Irritants, such as smoke or pollution.  Gastroesophageal reflux disease (GERD). HOME CARE INSTRUCTIONS   Only take over-the-counter medicines as directed by your caregiver.  Drink enough fluids to keep your urine clear or pale yellow.  Rest as needed.  Try using throat sprays, lozenges, or sucking on hard candy to ease any pain (if older than 4 years or as directed).  Sip warm liquids, such as broth, herbal tea, or warm water with honey to relieve pain temporarily. You may also eat or drink cold or frozen liquids such as frozen ice pops.  Gargle with salt water (mix 1 tsp salt with 8 oz of water).  Do not smoke and avoid secondhand smoke.  Put a cool-mist humidifier in your bedroom at night to moisten the air. You can also turn on a hot shower and sit in the bathroom with the door closed for 5-10 minutes. SEEK IMMEDIATE MEDICAL CARE IF:  You have difficulty breathing.  You are  unable to swallow fluids, soft foods, or your saliva.  You have increased swelling in the throat.  Your sore throat does not get better in 7 days.  You have nausea and vomiting.  You have a fever or persistent symptoms for more than 2-3 days.  You have a fever and your symptoms suddenly get worse. MAKE SURE YOU:   Understand these instructions.  Will watch your condition.  Will get help right away if you are not doing well or get worse.   This information is not intended to replace advice given to you by your health care provider. Make sure you discuss any questions you have with your health care provider.   Document Released: 06/23/2004 Document Revised: 06/06/2014 Document Reviewed: 01/22/2012 Elsevier Interactive Patient Education [employer]2016 [employer]

## 2015-08-20 LAB — CULTURE, GROUP A STREP (THRC)

## 2017-02-07 ENCOUNTER — Encounter (HOSPITAL_COMMUNITY): Payer: Self-pay | Admitting: *Deleted

## 2017-02-07 ENCOUNTER — Ambulatory Visit (HOSPITAL_COMMUNITY)
Admission: EM | Admit: 2017-02-07 | Discharge: 2017-02-07 | Disposition: A | Discharge status: Home / Self Care | Payer: 59 | Attending: Emergency Medicine | Admitting: Emergency Medicine

## 2017-02-07 DIAGNOSIS — J9801 Acute bronchospasm: Secondary | ICD-10-CM

## 2017-02-07 DIAGNOSIS — R05 Cough: Secondary | ICD-10-CM | POA: Diagnosis not present

## 2017-02-07 DIAGNOSIS — J069 Acute upper respiratory infection, unspecified: Secondary | ICD-10-CM | POA: Diagnosis not present

## 2017-02-07 DIAGNOSIS — R059 Cough, unspecified: Secondary | ICD-10-CM

## 2017-02-07 MED ORDER — ALBUTEROL SULFATE HFA 108 (90 BASE) MCG/ACT IN AERS: 2.0000 | INHALATION_SPRAY | RESPIRATORY_TRACT | 0 refills | Status: DC | PRN

## 2017-02-07 MED ORDER — IPRATROPIUM BROMIDE 0.06 % NA SOLN: 2.0000 | Freq: Four times a day (QID) | NASAL | 0 refills | Status: DC

## 2017-02-07 MED ORDER — PREDNISONE 50 MG PO TABS: ORAL_TABLET | ORAL | 0 refills | Status: DC

## 2017-02-07 MED FILL — PROVENTIL HFA 108 (90 BASE): 108 (90 BAS | 25 days supply | Qty: 7 | Fill #0

## 2017-02-07 MED FILL — predniSONE 50 MG TABS: 50 | 6 days supply | Qty: 6 | Fill #0

## 2017-02-07 MED FILL — IPRATROPIUM 0.06% SPRAY: 0.06 | 30 days supply | Qty: 15 | Fill #0

## 2017-02-07 NOTE — ED Provider Notes (Signed)
[facility]    CSN: [id number] Arrival date & time: 02/07/17  1002     History   Chief Complaint Chief Complaint  Patient presents with  . Cough    HPI Tracey Whitney is a 26 y.o. female.   26 year old female states that yesterday around and she developed a cough associated with onset of PND, mild scratchy sore throat, headache, eye drainage and nasal drainage. Eyes night she kept getting choked while sleeping and her cough is dry and frequent hacking. She has taken Mucinex DM. Minimal relief.      Past Medical History:  Diagnosis Date  . Anemia   . WUJWJXBJ(478.2Headache(784.0)     Patient Active Problem List   Diagnosis Date Noted  . DUB (dysfunctional uterine bleeding) 08/18/2014  . Complete miscarriage 02/11/2013  . Anemia 09/06/2012  . Excessive or frequent menstruation 09/06/2012  . Active labor 02/22/2012    Past Surgical History:  Procedure Laterality Date  . WISDOM TOOTH EXTRACTION      OB History    Gravida Para Term Preterm AB Living   3 2 2   1 2    SAB TAB Ectopic Multiple Live Births   1       2       Home Medications    Prior to Admission medications   Medication Sig Start Date End Date Taking? Authorizing Provider  albuterol (PROVENTIL HFA;VENTOLIN HFA) 108 (90 Base) MCG/ACT inhaler Inhale 2 puffs into the lungs every 4 (four) hours as needed for wheezing or shortness of breath. 02/07/17   Hayden RasmussenMabe, Kyna Blahnik, NP  ferrous sulfate 325 (65 FE) MG tablet Take 325 mg by mouth daily with breakfast.    [provider]  ipratropium (ATROVENT) 0.06 % nasal spray Place 2 sprays into both nostrils 4 (four) times daily. 02/07/17   Hayden RasmussenMabe, Sophina Mitten, NP  predniSONE (DELTASONE) 50 MG tablet 1 tab po daily for 6 days. Take with food. 02/07/17   Hayden RasmussenMabe, Ilissa Rosner, NP    Family History Family History  Problem Relation Age of Onset  . Cancer Paternal Aunt   . Other Neg Hx     Social History Social History  Substance Use Topics  . Smoking status: Never Smoker    . Smokeless tobacco: Never Used  . Alcohol use Yes     Comment: occ     Allergies   Latex   Review of Systems Review of Systems  Constitutional: Negative.  Negative for activity change, appetite change, chills, fatigue and fever.  HENT: Positive for congestion, postnasal drip, rhinorrhea and sore throat. Negative for facial swelling.   Eyes: Negative.   Respiratory: Positive for cough. Negative for shortness of breath.   Cardiovascular: Negative.   Musculoskeletal: Negative for neck pain and neck stiffness.  Skin: Negative for pallor and rash.  Neurological: Negative.   All other systems reviewed and are negative.    Physical Exam Triage Vital Signs ED Triage Vitals  Enc Vitals Group     BP 02/07/17 1024 116/72     Pulse Rate 02/07/17 1024 78     Resp 02/07/17 1024 18     Temp 02/07/17 1024 98.6 F (37 C)     Temp Source 02/07/17 1024 Oral     SpO2 02/07/17 1024 99 %     Weight --      Height --      Head Circumference --      Peak Flow --      Pain Score 02/07/17  1026 8     Pain Loc --      Pain Edu? --      Excl. in GC? --    No data found.   Updated Vital Signs BP 116/72 (BP Location: Right Arm)   Pulse 78   Temp 98.6 F (37 C) (Oral)   Resp 18   LMP 01/11/2017   SpO2 99%   Visual Acuity Right Eye Distance:   Left Eye Distance:   Bilateral Distance:    Right Eye Near:   Left Eye Near:    Bilateral Near:     Physical Exam  Constitutional: She is oriented to person, place, and time. She appears well-developed and well-nourished. No distress.  HENT:  Bilateral TMs are mildly retracted otherwise normal. Oropharynx with minimal injection, otherwise normal, no exudates, swelling or visualized PND.  Eyes: EOM are normal. Right eye exhibits no discharge. Left eye exhibits no discharge.  Neck: Normal range of motion. Neck supple.  Cardiovascular: Normal rate, regular rhythm, normal heart sounds and intact distal pulses.   Pulmonary/Chest: Effort  normal. No respiratory distress.  Tidal volume without adventitious sounds. Deep breaths refill expiratory wheeze and coarseness with forced expiration and cough.  Musculoskeletal: Normal range of motion. She exhibits no edema.  Lymphadenopathy:    She has no cervical adenopathy.  Neurological: She is alert and oriented to person, place, and time.  Skin: Skin is warm and dry. No rash noted.  Psychiatric: She has a normal mood and affect.     UC Treatments / Results  Labs (all labs ordered are listed, but only abnormal results are displayed) Labs Reviewed - No data to display  EKG  EKG Interpretation None       Radiology No results found.  Procedures Procedures (including critical care time)  Medications Ordered in UC Medications - No data to display   Initial Impression / Assessment and Plan / UC Course  I have reviewed the triage vital signs and the nursing notes.  Pertinent labs & imaging results that were available during my care of the patient were reviewed by me and considered in my medical decision making (see chart for details).     Recommend taking nonsedating antihistamine during the day such as Allegra or Zyrtec. At nighttime if needed may take Chlor-Trimeton 2 or 4 mg every 4 hours. This may cause some drowsiness. Drink plenty fluids and stay well-hydrated. Use the medications as directed this should help with most of your symptoms.   Final Clinical Impressions(s) / UC Diagnoses   Final diagnoses:  Cough  Bronchospasm  Acute upper respiratory infection  Viral upper respiratory tract infection    New Prescriptions New Prescriptions   ALBUTEROL (PROVENTIL HFA;VENTOLIN HFA) 108 (90 BASE) MCG/ACT INHALER    Inhale 2 puffs into the lungs every 4 (four) hours as needed for wheezing or shortness of breath.   IPRATROPIUM (ATROVENT) 0.06 % NASAL SPRAY    Place 2 sprays into both nostrils 4 (four) times daily.   PREDNISONE (DELTASONE) 50 MG TABLET    1 tab po  daily for 6 days. Take with food.     Controlled Substance Prescriptions [place] Controlled Substance Registry consulted? Not Applicable   Hayden Rasmussen, NP 02/07/17 1044

## 2017-02-07 NOTE — Discharge Instructions (Signed)
Recommend taking nonsedating antihistamine during the day such as Allegra or Zyrtec. At nighttime if needed may take Chlor-Trimeton 2 or 4 mg every 4 hours. This may cause some drowsiness. Drink plenty fluids and stay well-hydrated. Use the medications as directed this should help with most of your symptoms.

## 2017-02-07 NOTE — ED Triage Notes (Signed)
Pt  Reports        Cough  And   Congested     With   sorethroat  /   Runny  Nose    And  Congestion       Pt     Symptoms  Not  releived  By otc  meds

## 2017-03-21 DIAGNOSIS — H5213 Myopia, bilateral: Secondary | ICD-10-CM | POA: Diagnosis not present

## 2017-03-21 DIAGNOSIS — H52223 Regular astigmatism, bilateral: Secondary | ICD-10-CM | POA: Diagnosis not present

## 2017-06-03 ENCOUNTER — Other Ambulatory Visit: Payer: Self-pay

## 2017-06-03 ENCOUNTER — Encounter (HOSPITAL_COMMUNITY): Payer: Self-pay | Admitting: Emergency Medicine

## 2017-06-03 ENCOUNTER — Ambulatory Visit (HOSPITAL_COMMUNITY)
Admission: EM | Admit: 2017-06-03 | Discharge: 2017-06-03 | Disposition: A | Discharge status: Home / Self Care | Payer: No Typology Code available for payment source | Attending: Family Medicine | Admitting: Family Medicine

## 2017-06-03 DIAGNOSIS — J029 Acute pharyngitis, unspecified: Secondary | ICD-10-CM | POA: Insufficient documentation

## 2017-06-03 LAB — POCT RAPID STREP A: STREPTOCOCCUS, GROUP A SCREEN (DIRECT): NEGATIVE

## 2017-06-03 NOTE — ED Provider Notes (Signed)
  [facility]   [id number] 06/03/17 Arrival Time: 1218  ASSESSMENT & PLAN:  1. Sore throat    Rapid strep negative. Culture sent.  OTC analgesics and throat care as needed. Work note given.  Will follow up if not showing significant improvement over the next 48-72 hours.  Reviewed expectations re: course of current medical issues. Questions answered. Outlined signs and symptoms indicating need for more acute intervention. Patient verbalized understanding. After Visit Summary given.   SUBJECTIVE:  Tracey Whitney is a 27 y.o. female who reports a sore throat. Describes as sharp and more L-sided. Onset abrupt beginning 2 days ago. No respiratory symptoms. Normal PO intake but reports discomfort with swallowing. No associated n/v/abdominal symptoms. Sick contacts: none. Occasional chills. No fever reported.  OTC treatment: Lozenges without much relief.  ROS: As per HPI.   OBJECTIVE:  Vitals:   06/03/17 1311  BP: 114/79  Pulse: 79  Resp: 16  Temp: 98 F (36.7 C)  TempSrc: Oral  SpO2: 97%     General appearance: alert; no distress HEENT: throat with mild erythema Neck: supple with FROM; small cervical LAD bilaterally Lungs: clear to auscultation bilaterally Skin: reveals no rash; warm and dry Psychological: alert and cooperative; normal mood and affect  Allergies  Allergen Reactions  . Latex Itching    Past Medical History:  Diagnosis Date  . Anemia   . Headache(784.0)    Social History   Socioeconomic History  . Marital status: Single    Spouse name: Not on file  . Number of children: Not on file  . Years of education: Not on file  . Highest education level: Not on file  Social Needs  . Financial resource strain: Not on file  . Food insecurity - worry: Not on file  . Food insecurity - inability: Not on file  . Transportation needs - medical: Not on file  . Transportation needs - non-medical: Not on file  Occupational History  . Not on  file  Tobacco Use  . Smoking status: Never Smoker  . Smokeless tobacco: Never Used  Substance and Sexual Activity  . Alcohol use: Yes    Comment: occ  . Drug use: No  . Sexual activity: Not Currently    Birth control/protection: None  Other Topics Concern  . Not on file  Social History Narrative  . Not on file   Family History  Problem Relation Age of Onset  . Cancer Paternal Aunt   . Other Neg Hx           Mardella LaymanHagler, Alani Sabbagh, MD 06/03/17 1349

## 2017-06-03 NOTE — ED Triage Notes (Signed)
Pt x2 days, c/o pain in the L side of her throat, states it felt like "some food was stuck in it". Pt states "my mom looked in my throat and saw sores in it". Pt also c/o L ear pain.

## 2017-06-03 NOTE — Discharge Instructions (Signed)
We have sent a throat culture. We will notify you of any positive results.

## 2017-06-06 LAB — CULTURE, GROUP A STREP (THRC)

## 2017-07-05 NOTE — Progress Notes (Signed)
Chief Complaint  Patient presents with  . New Patient (Initial Visit)    establish care. Pt needs pap but on cycle.  Wants to discuss her anxiety, and regular deodorant is not holding her and wonders if the anxiety is the cause  . Dysmenorrhea    Subjective:  Tracey Whitney is a 27 y.o. female here for a health maintenance visit.  Patient is new pt here for a physical  In addition to the physical she had a the following concern:  GAD 7 : Generalized Anxiety Score 07/06/2017  Nervous, Anxious, on Edge 2  Control/stop worrying 2  Worry too much - different things 2  Trouble relaxing 2  Restless 1  Easily annoyed or irritable 2  Afraid - awful might happen 2  Total GAD 7 Score 13  Anxiety Difficulty Not difficult at all    Pt is a Education administrator at [employer] She states that she is a mother of 2 girls 39yo and 63yo She gets sweats, headaches, loss of appetite and worrying all the time She states that she is having stress at work  She reports that she has monthly periods but 2 weeks into her cycle she gets cramping and again while she is on her period Patient's last menstrual period was 07/03/2017.  Patient Active Problem List   Diagnosis Date Noted  . DUB (dysfunctional uterine bleeding) 08/18/2014  . Complete miscarriage 02/11/2013  . Anemia 09/06/2012  . Excessive or frequent menstruation 09/06/2012  . Active labor 02/22/2012    Past Medical History:  Diagnosis Date  . Anemia   . DPOEUMPN(361.4)     Past Surgical History:  Procedure Laterality Date  . WISDOM TOOTH EXTRACTION       Outpatient Medications Prior to Visit  Medication Sig Dispense Refill  . albuterol (PROVENTIL HFA;VENTOLIN HFA) 108 (90 Base) MCG/ACT inhaler Inhale 2 puffs into the lungs every 4 (four) hours as needed for wheezing or shortness of breath. (Patient not taking: Reported on 06/03/2017) 1 Inhaler 0  . ferrous sulfate 325 (65 FE) MG tablet Take 325 mg by mouth daily with breakfast.    .  ipratropium (ATROVENT) 0.06 % nasal spray Place 2 sprays into both nostrils 4 (four) times daily. (Patient not taking: Reported on 06/03/2017) 15 mL 0  . predniSONE (DELTASONE) 50 MG tablet 1 tab po daily for 6 days. Take with food. (Patient not taking: Reported on 06/03/2017) 6 tablet 0   No facility-administered medications prior to visit.     Allergies  Allergen Reactions  . Latex Itching     Family History  Problem Relation Age of Onset  . Cancer Paternal Aunt   . Other Neg Hx      Health Habits: Dental Exam: up to date Eye Exam: up to date Exercise: 0 times/week on average Current exercise activities: walking/running Diet:   Social History   Socioeconomic History  . Marital status: Single    Spouse name: Not on file  . Number of children: Not on file  . Years of education: Not on file  . Highest education level: Not on file  Social Needs  . Financial resource strain: Not on file  . Food insecurity - worry: Not on file  . Food insecurity - inability: Not on file  . Transportation needs - medical: Not on file  . Transportation needs - non-medical: Not on file  Occupational History  . Not on file  Tobacco Use  . Smoking status: Never Smoker  . Smokeless  tobacco: Never Used  Substance and Sexual Activity  . Alcohol use: Yes    Comment: occ  . Drug use: No  . Sexual activity: Not Currently    Birth control/protection: None  Other Topics Concern  . Not on file  Social History Narrative  . Not on file   Social History   Substance and Sexual Activity  Alcohol Use Yes   Comment: occ   Social History   Tobacco Use  Smoking Status Never Smoker  Smokeless Tobacco Never Used   Social History   Substance and Sexual Activity  Drug Use No    GYN: Sexual Health Menstrual status: regular menses LMP: Patient's last menstrual period was 07/03/2017. Last pap smear: see HM section History of abnormal pap smears:  Sexually active: ** with ** partner Current  contraception:   Health Maintenance: See under health Maintenance activity for review of completion dates as well. Immunization History  Administered Date(s) Administered  . Tdap 02/24/2012      Depression Screen-PHQ2/9 Depression screen [facility] 2/9 07/06/2017 07/06/2017  Decreased Interest 0 0  Down, Depressed, Hopeless 0 0  PHQ - 2 Score 0 0     Depression Severity and Treatment Recommendations:  0-4= None  5-9= Mild / Treatment: Support, educate to call if worse; return in one month  10-14= Moderate / Treatment: Support, watchful waiting; Antidepressant or Psycotherapy  15-19= Moderately severe / Treatment: Antidepressant OR Psychotherapy  >= 20 = Major depression, severe / Antidepressant AND Psychotherapy    Review of Systems   Review of Systems  Constitutional: Negative for chills and fever.  HENT: Negative for ear discharge, ear pain, hearing loss and tinnitus.   Eyes: Negative for blurred vision and double vision.  Respiratory: Negative for cough and shortness of breath.   Cardiovascular: Negative for chest pain and palpitations.  Gastrointestinal: Negative for abdominal pain, diarrhea, nausea and vomiting.  Genitourinary: Negative for dysuria and urgency.  Skin: Negative for itching and rash.  Neurological: Negative for dizziness, tingling, tremors and headaches.  Psychiatric/Behavioral: Negative for depression and hallucinations. The patient is nervous/anxious.     See HPI for ROS as well.    Objective:   Vitals:   07/06/17 1117  BP: 101/65  Pulse: 98  Resp: 16  Temp: 98.4 F (36.9 C)  TempSrc: Oral  SpO2: 96%  Weight: 149 lb 12.8 oz (67.9 kg)  Height: 5' 9.5" (1.765 m)    Body mass index is 21.8 kg/m.  Physical Exam  Constitutional: She is oriented to person, place, and time. She appears well-developed and well-nourished.  HENT:  Head: Normocephalic and atraumatic.  Right Ear: External ear normal.  Left Ear: External ear normal.  Nose: Nose  normal.  Mouth/Throat: Oropharynx is clear and moist.  Eyes: Conjunctivae and EOM are normal.  Neck: Normal range of motion. Neck supple.  Cardiovascular: Normal rate, regular rhythm and normal heart sounds.  No murmur heard. Pulmonary/Chest: Effort normal and breath sounds normal. No respiratory distress. She has no wheezes. She has no rales.  Abdominal: Soft. Bowel sounds are normal. She exhibits no distension. There is no tenderness. There is no rebound and no guarding.  Musculoskeletal: Normal range of motion. She exhibits no edema.  Neurological: She is alert and oriented to person, place, and time. She has normal reflexes.  Skin: Skin is warm. No erythema.  Psychiatric: She has a normal mood and affect. Her behavior is normal. Judgment and thought content normal.       Assessment/Plan:  Patient was seen for a health maintenance exam.  Counseled the patient on health maintenance issues. Reviewed her health mainteance schedule and ordered appropriate tests (see orders.) Counseled on regular exercise and weight management. Recommend regular eye exams and dental cleaning.   The following issues were addressed today for health maintenance:   Lonetta was seen today for new patient (initial visit) and dysmenorrhea.  Diagnoses and all orders for this visit:  Encounter for health maintenance examination in adult- age appropriate screenings reviewed Pt to return for pap  -     TSH -     CBC -     Basic metabolic panel  Generalized anxiety disorder- referred pt to [facility] for counseling Will check labs and evaluate for underlying causes -     TSH -     CBC -     Basic metabolic panel  Other orders -     Cancel: Flu Vaccine QUAD 36+ mos IM -     Cancel: Pap IG, CT/NG NAA, and HPV (high risk) [facility]    Return in about 4 weeks (around 08/03/2017) for pap smear in one month.    Body mass index is 21.8 kg/m.:  Discussed the patient's BMI with patient. The BMI  body mass index is 21.8 kg/m.     Future Appointments  Date Time Provider [place]  07/12/2017  1:20 PM Forrest Moron, MD PCP-PCP [facility]    Patient Instructions       IF you received an x-ray today, you will receive an invoice from [facility]. Please contact [facility] at [phone] with questions or concerns regarding your invoice.   IF you received labwork today, you will receive an invoice from [facility]. Please contact [facility] at [phone] with questions or concerns regarding your invoice.   Our billing staff will not be able to assist you with questions regarding bills from these companies.  You will be contacted with the lab results as soon as they are available. The fastest way to get your results is to activate your My Chart account. Instructions are located on the last page of this paperwork. If you have not heard from [place] regarding the results in 2 weeks, please contact this office.    Health Maintenance, Female Adopting a healthy lifestyle and getting preventive care can go a long way to promote health and wellness. Talk with your health care provider about what schedule of regular examinations is right for you. This is a good chance for you to check in with your provider about disease prevention and staying healthy. In between checkups, there are plenty of things you can do on your own. Experts have done a lot of research about which lifestyle changes and preventive measures are most likely to keep you healthy. Ask your health care provider for more information. Weight and diet Eat a healthy diet  Be sure to include plenty of vegetables, fruits, low-fat dairy products, and lean protein.  Do not eat a lot of foods high in solid fats, added sugars, or salt.  Get regular exercise. This is one of the most important things you can do for your health. ? Most adults should exercise for at least 150 minutes each week. The exercise should increase  your heart rate and make you sweat (moderate-intensity exercise). ? Most adults should also do strengthening exercises at least twice a week. This is in addition to the moderate-intensity exercise.  Maintain a healthy weight  Body mass index (BMI) is  a measurement that can be used to identify possible weight problems. It estimates body fat based on height and weight. Your health care provider can help determine your BMI and help you achieve or maintain a healthy weight.  For females 40 years of age and older: ? A BMI below 18.5 is considered underweight. ? A BMI of 18.5 to 24.9 is normal. ? A BMI of 25 to 29.9 is considered overweight. ? A BMI of 30 and above is considered obese.  Watch levels of cholesterol and blood lipids  You should start having your blood tested for lipids and cholesterol at 29 years of age, then have this test every 5 years.  You may need to have your cholesterol levels checked more often if: ? Your lipid or cholesterol levels are high. ? You are older than 29 years of age. ? You are at high risk for heart disease.  Cancer screening Lung Cancer  Lung cancer screening is recommended for adults 35-69 years old who are at high risk for lung cancer because of a history of smoking.  A yearly low-dose CT scan of the lungs is recommended for people who: ? Currently smoke. ? Have quit within the past 15 years. ? Have at least a 30-pack-year history of smoking. A pack year is smoking an average of one pack of cigarettes a day for 1 year.  Yearly screening should continue until it has been 15 years since you quit.  Yearly screening should stop if you develop a health problem that would prevent you from having lung cancer treatment.  Breast Cancer  Practice breast self-awareness. This means understanding how your breasts normally appear and feel.  It also means doing regular breast self-exams. Let your health care provider know about any changes, no matter how  small.  If you are in your 20s or 30s, you should have a clinical breast exam (CBE) by a health care provider every 1-3 years as part of a regular health exam.  If you are 85 or older, have a CBE every year. Also consider having a breast X-ray (mammogram) every year.  If you have a family history of breast cancer, talk to your health care provider about genetic screening.  If you are at high risk for breast cancer, talk to your health care provider about having an MRI and a mammogram every year.  Breast cancer gene (BRCA) assessment is recommended for women who have family members with BRCA-related cancers. BRCA-related cancers include: ? Breast. ? Ovarian. ? Tubal. ? Peritoneal cancers.  Results of the assessment will determine the need for genetic counseling and BRCA1 and BRCA2 testing.  Cervical Cancer Your health care provider may recommend that you be screened regularly for cancer of the pelvic organs (ovaries, uterus, and vagina). This screening involves a pelvic examination, including checking for microscopic changes to the surface of your cervix (Pap test). You may be encouraged to have this screening done every 3 years, beginning at age 38.  For women ages 51-65, health care providers may recommend pelvic exams and Pap testing every 3 years, or they may recommend the Pap and pelvic exam, combined with testing for human papilloma virus (HPV), every 5 years. Some types of HPV increase your risk of cervical cancer. Testing for HPV may also be done on women of any age with unclear Pap test results.  Other health care providers may not recommend any screening for nonpregnant women who are considered low risk for pelvic cancer and who do  not have symptoms. Ask your health care provider if a screening pelvic exam is right for you.  If you have had past treatment for cervical cancer or a condition that could lead to cancer, you need Pap tests and screening for cancer for at least 20 years  after your treatment. If Pap tests have been discontinued, your risk factors (such as having a new sexual partner) need to be reassessed to determine if screening should resume. Some women have medical problems that increase the chance of getting cervical cancer. In these cases, your health care provider may recommend more frequent screening and Pap tests.  Colorectal Cancer  This type of cancer can be detected and often prevented.  Routine colorectal cancer screening usually begins at 29 years of age and continues through 29 years of age.  Your health care provider may recommend screening at an earlier age if you have risk factors for colon cancer.  Your health care provider may also recommend using home test kits to check for hidden blood in the stool.  A small camera at the end of a tube can be used to examine your colon directly (sigmoidoscopy or colonoscopy). This is done to check for the earliest forms of colorectal cancer.  Routine screening usually begins at age 35.  Direct examination of the colon should be repeated every 5-10 years through 29 years of age. However, you may need to be screened more often if early forms of precancerous polyps or small growths are found.  Skin Cancer  Check your skin from head to toe regularly.  Tell your health care provider about any new moles or changes in moles, especially if there is a change in a mole's shape or color.  Also tell your health care provider if you have a mole that is larger than the size of a pencil eraser.  Always use sunscreen. Apply sunscreen liberally and repeatedly throughout the day.  Protect yourself by wearing long sleeves, pants, a wide-brimmed hat, and sunglasses whenever you are outside.  Heart disease, diabetes, and high blood pressure  High blood pressure causes heart disease and increases the risk of stroke. High blood pressure is more likely to develop in: ? People who have blood pressure in the high end of  the normal range (130-139/85-89 mm Hg). ? People who are overweight or obese. ? People who are African American.  If you are 51-82 years of age, have your blood pressure checked every 3-5 years. If you are 54 years of age or older, have your blood pressure checked every year. You should have your blood pressure measured twice-once when you are at a hospital or clinic, and once when you are not at a hospital or clinic. Record the average of the two measurements. To check your blood pressure when you are not at a hospital or clinic, you can use: ? An automated blood pressure machine at a pharmacy. ? A home blood pressure monitor.  If you are between 3 years and 35 years old, ask your health care provider if you should take aspirin to prevent strokes.  Have regular diabetes screenings. This involves taking a blood sample to check your fasting blood sugar level. ? If you are at a normal weight and have a low risk for diabetes, have this test once every three years after 29 years of age. ? If you are overweight and have a high risk for diabetes, consider being tested at a younger age or more often. Preventing infection Hepatitis B  If you have a higher risk for hepatitis B, you should be screened for this virus. You are considered at high risk for hepatitis B if: ? You were born in a country where hepatitis B is common. Ask your health care provider which countries are considered high risk. ? Your parents were born in a high-risk country, and you have not been immunized against hepatitis B (hepatitis B vaccine). ? You have HIV or AIDS. ? You use needles to inject street drugs. ? You live with someone who has hepatitis B. ? You have had sex with someone who has hepatitis B. ? You get hemodialysis treatment. ? You take certain medicines for conditions, including cancer, organ transplantation, and autoimmune conditions.  Hepatitis C  Blood testing is recommended for: ? Everyone born from [birth year]  through 1967. ? Anyone with known risk factors for hepatitis C.  Sexually transmitted infections (STIs)  You should be screened for sexually transmitted infections (STIs) including gonorrhea and chlamydia if: ? You are sexually active and are younger than 29 years of age. ? You are older than 29 years of age and your health care provider tells you that you are at risk for this type of infection. ? Your sexual activity has changed since you were last screened and you are at an increased risk for chlamydia or gonorrhea. Ask your health care provider if you are at risk.  If you do not have HIV, but are at risk, it may be recommended that you take a prescription medicine daily to prevent HIV infection. This is called pre-exposure prophylaxis (PrEP). You are considered at risk if: ? You are sexually active and do not regularly use condoms or know the HIV status of your partner(s). ? You take drugs by injection. ? You are sexually active with a partner who has HIV.  Talk with your health care provider about whether you are at high risk of being infected with HIV. If you choose to begin PrEP, you should first be tested for HIV. You should then be tested every 3 months for as long as you are taking PrEP. Pregnancy  If you are premenopausal and you may become pregnant, ask your health care provider about preconception counseling.  If you may become pregnant, take 400 to 800 micrograms (mcg) of folic acid every day.  If you want to prevent pregnancy, talk to your health care provider about birth control (contraception). Osteoporosis and menopause  Osteoporosis is a disease in which the bones lose minerals and strength with aging. This can result in serious bone fractures. Your risk for osteoporosis can be identified using a bone density scan.  If you are 52 years of age or older, or if you are at risk for osteoporosis and fractures, ask your health care provider if you should be screened.  Ask  your health care provider whether you should take a calcium or vitamin D supplement to lower your risk for osteoporosis.  Menopause may have certain physical symptoms and risks.  Hormone replacement therapy may reduce some of these symptoms and risks. Talk to your health care provider about whether hormone replacement therapy is right for you. Follow these instructions at home:  Schedule regular health, dental, and eye exams.  Stay current with your immunizations.  Do not use any tobacco products including cigarettes, chewing tobacco, or electronic cigarettes.  If you are pregnant, do not drink alcohol.  If you are breastfeeding, limit how much and how often you drink alcohol.  Limit alcohol  intake to no more than 1 drink per day for nonpregnant women. One drink equals 12 ounces of beer, 5 ounces of wine, or 1 ounces of hard liquor.  Do not use street drugs.  Do not share needles.  Ask your health care provider for help if you need support or information about quitting drugs.  Tell your health care provider if you often feel depressed.  Tell your health care provider if you have ever been abused or do not feel safe at home. This information is not intended to replace advice given to you by your health care provider. Make sure you discuss any questions you have with your health care provider. Document Released: 11/29/2010 Document Revised: 10/22/2015 Document Reviewed: 02/17/2015 Elsevier Interactive Patient Education  [employer].

## 2017-07-06 ENCOUNTER — Encounter: Payer: Self-pay | Admitting: Family Medicine

## 2017-07-06 ENCOUNTER — Other Ambulatory Visit: Payer: Self-pay

## 2017-07-06 ENCOUNTER — Ambulatory Visit (INDEPENDENT_AMBULATORY_CARE_PROVIDER_SITE_OTHER): Payer: No Typology Code available for payment source | Admitting: Family Medicine

## 2017-07-06 VITALS — BP 101/65 | HR 98 | Temp 98.4°F | Resp 16 | Ht 69.5 in | Wt 149.8 lb

## 2017-07-06 DIAGNOSIS — F411 Generalized anxiety disorder: Secondary | ICD-10-CM

## 2017-07-06 DIAGNOSIS — Z0000 Encounter for general adult medical examination without abnormal findings: Secondary | ICD-10-CM | POA: Diagnosis not present

## 2017-07-06 NOTE — Patient Instructions (Addendum)
   IF you received an x-ray today, you will receive an invoice from [facility]. Please contact [facility] at [phone] with questions or concerns regarding your invoice.   IF you received labwork today, you will receive an invoice from [facility]. Please contact [facility] at [phone] with questions or concerns regarding your invoice.   Our billing staff will not be able to assist you with questions regarding bills from these companies.  You will be contacted with the lab results as soon as they are available. The fastest way to get your results is to activate your My Chart account. Instructions are located on the last page of this paperwork. If you have not heard from us regarding the results in 2 weeks, please contact this office.    Health Maintenance, Female Adopting a healthy lifestyle and getting preventive care can go a long way to promote health and wellness. Talk with your health care provider about what schedule of regular examinations is right for you. This is a good chance for you to check in with your provider about disease prevention and staying healthy. In between checkups, there are plenty of things you can do on your own. Experts have done a lot of research about which lifestyle changes and preventive measures are most likely to keep you healthy. Ask your health care provider for more information. Weight and diet Eat a healthy diet  Be sure to include plenty of vegetables, fruits, low-fat dairy products, and lean protein.  Do not eat a lot of foods high in solid fats, added sugars, or salt.  Get regular exercise. This is one of the most important things you can do for your health. ? Most adults should exercise for at least 150 minutes each week. The exercise should increase your heart rate and make you sweat (moderate-intensity exercise). ? Most adults should also do strengthening exercises at least twice a week. This is in addition to the  moderate-intensity exercise.  Maintain a healthy weight  Body mass index (BMI) is a measurement that can be used to identify possible weight problems. It estimates body fat based on height and weight. Your health care provider can help determine your BMI and help you achieve or maintain a healthy weight.  For females 20 years of age and older: ? A BMI below 18.5 is considered underweight. ? A BMI of 18.5 to 24.9 is normal. ? A BMI of 25 to 29.9 is considered overweight. ? A BMI of 30 and above is considered obese.  Watch levels of cholesterol and blood lipids  You should start having your blood tested for lipids and cholesterol at 29 years of age, then have this test every 5 years.  You may need to have your cholesterol levels checked more often if: ? Your lipid or cholesterol levels are high. ? You are older than 29 years of age. ? You are at high risk for heart disease.  Cancer screening Lung Cancer  Lung cancer screening is recommended for adults 55-80 years old who are at high risk for lung cancer because of a history of smoking.  A yearly low-dose CT scan of the lungs is recommended for people who: ? Currently smoke. ? Have quit within the past 15 years. ? Have at least a 30-pack-year history of smoking. A pack year is smoking an average of one pack of cigarettes a day for 1 year.  Yearly screening should continue until it has been 15 years since you quit.  Yearly screening   should stop if you develop a health problem that would prevent you from having lung cancer treatment.  Breast Cancer  Practice breast self-awareness. This means understanding how your breasts normally appear and feel.  It also means doing regular breast self-exams. Let your health care provider know about any changes, no matter how small.  If you are in your 20s or 30s, you should have a clinical breast exam (CBE) by a health care provider every 1-3 years as part of a regular health exam.  If you  are 42 or older, have a CBE every year. Also consider having a breast X-ray (mammogram) every year.  If you have a family history of breast cancer, talk to your health care provider about genetic screening.  If you are at high risk for breast cancer, talk to your health care provider about having an MRI and a mammogram every year.  Breast cancer gene (BRCA) assessment is recommended for women who have family members with BRCA-related cancers. BRCA-related cancers include: ? Breast. ? Ovarian. ? Tubal. ? Peritoneal cancers.  Results of the assessment will determine the need for genetic counseling and BRCA1 and BRCA2 testing.  Cervical Cancer Your health care provider may recommend that you be screened regularly for cancer of the pelvic organs (ovaries, uterus, and vagina). This screening involves a pelvic examination, including checking for microscopic changes to the surface of your cervix (Pap test). You may be encouraged to have this screening done every 3 years, beginning at age 56.  For women ages 41-65, health care providers may recommend pelvic exams and Pap testing every 3 years, or they may recommend the Pap and pelvic exam, combined with testing for human papilloma virus (HPV), every 5 years. Some types of HPV increase your risk of cervical cancer. Testing for HPV may also be done on women of any age with unclear Pap test results.  Other health care providers may not recommend any screening for nonpregnant women who are considered low risk for pelvic cancer and who do not have symptoms. Ask your health care provider if a screening pelvic exam is right for you.  If you have had past treatment for cervical cancer or a condition that could lead to cancer, you need Pap tests and screening for cancer for at least 20 years after your treatment. If Pap tests have been discontinued, your risk factors (such as having a new sexual partner) need to be reassessed to determine if screening should  resume. Some women have medical problems that increase the chance of getting cervical cancer. In these cases, your health care provider may recommend more frequent screening and Pap tests.  Colorectal Cancer  This type of cancer can be detected and often prevented.  Routine colorectal cancer screening usually begins at 29 years of age and continues through 29 years of age.  Your health care provider may recommend screening at an earlier age if you have risk factors for colon cancer.  Your health care provider may also recommend using home test kits to check for hidden blood in the stool.  A small camera at the end of a tube can be used to examine your colon directly (sigmoidoscopy or colonoscopy). This is done to check for the earliest forms of colorectal cancer.  Routine screening usually begins at age 16.  Direct examination of the colon should be repeated every 5-10 years through 29 years of age. However, you may need to be screened more often if early forms of precancerous polyps or  small growths are found.  Skin Cancer  Check your skin from head to toe regularly.  Tell your health care provider about any new moles or changes in moles, especially if there is a change in a mole's shape or color.  Also tell your health care provider if you have a mole that is larger than the size of a pencil eraser.  Always use sunscreen. Apply sunscreen liberally and repeatedly throughout the day.  Protect yourself by wearing long sleeves, pants, a wide-brimmed hat, and sunglasses whenever you are outside.  Heart disease, diabetes, and high blood pressure  High blood pressure causes heart disease and increases the risk of stroke. High blood pressure is more likely to develop in: ? People who have blood pressure in the high end of the normal range (130-139/85-89 mm Hg). ? People who are overweight or obese. ? People who are African American.  If you are 61-36 years of age, have your blood  pressure checked every 3-5 years. If you are 6 years of age or older, have your blood pressure checked every year. You should have your blood pressure measured twice-once when you are at a hospital or clinic, and once when you are not at a hospital or clinic. Record the average of the two measurements. To check your blood pressure when you are not at a hospital or clinic, you can use: ? An automated blood pressure machine at a pharmacy. ? A home blood pressure monitor.  If you are between 7 years and 29 years old, ask your health care provider if you should take aspirin to prevent strokes.  Have regular diabetes screenings. This involves taking a blood sample to check your fasting blood sugar level. ? If you are at a normal weight and have a low risk for diabetes, have this test once every three years after 29 years of age. ? If you are overweight and have a high risk for diabetes, consider being tested at a younger age or more often. Preventing infection Hepatitis B  If you have a higher risk for hepatitis B, you should be screened for this virus. You are considered at high risk for hepatitis B if: ? You were born in a country where hepatitis B is common. Ask your health care provider which countries are considered high risk. ? Your parents were born in a high-risk country, and you have not been immunized against hepatitis B (hepatitis B vaccine). ? You have HIV or AIDS. ? You use needles to inject street drugs. ? You live with someone who has hepatitis B. ? You have had sex with someone who has hepatitis B. ? You get hemodialysis treatment. ? You take certain medicines for conditions, including cancer, organ transplantation, and autoimmune conditions.  Hepatitis C  Blood testing is recommended for: ? Everyone born from [birth year] through 1967. ? Anyone with known risk factors for hepatitis C.  Sexually transmitted infections (STIs)  You should be screened for sexually transmitted  infections (STIs) including gonorrhea and chlamydia if: ? You are sexually active and are younger than 29 years of age. ? You are older than 29 years of age and your health care provider tells you that you are at risk for this type of infection. ? Your sexual activity has changed since you were last screened and you are at an increased risk for chlamydia or gonorrhea. Ask your health care provider if you are at risk.  If you do not have HIV, but are at risk, it  may be recommended that you take a prescription medicine daily to prevent HIV infection. This is called pre-exposure prophylaxis (PrEP). You are considered at risk if: ? You are sexually active and do not regularly use condoms or know the HIV status of your partner(s). ? You take drugs by injection. ? You are sexually active with a partner who has HIV.  Talk with your health care provider about whether you are at high risk of being infected with HIV. If you choose to begin PrEP, you should first be tested for HIV. You should then be tested every 3 months for as long as you are taking PrEP. Pregnancy  If you are premenopausal and you may become pregnant, ask your health care provider about preconception counseling.  If you may become pregnant, take 400 to 800 micrograms (mcg) of folic acid every day.  If you want to prevent pregnancy, talk to your health care provider about birth control (contraception). Osteoporosis and menopause  Osteoporosis is a disease in which the bones lose minerals and strength with aging. This can result in serious bone fractures. Your risk for osteoporosis can be identified using a bone density scan.  If you are 65 years of age or older, or if you are at risk for osteoporosis and fractures, ask your health care provider if you should be screened.  Ask your health care provider whether you should take a calcium or vitamin D supplement to lower your risk for osteoporosis.  Menopause may have certain physical  symptoms and risks.  Hormone replacement therapy may reduce some of these symptoms and risks. Talk to your health care provider about whether hormone replacement therapy is right for you. Follow these instructions at home:  Schedule regular health, dental, and eye exams.  Stay current with your immunizations.  Do not use any tobacco products including cigarettes, chewing tobacco, or electronic cigarettes.  If you are pregnant, do not drink alcohol.  If you are breastfeeding, limit how much and how often you drink alcohol.  Limit alcohol intake to no more than 1 drink per day for nonpregnant women. One drink equals 12 ounces of beer, 5 ounces of wine, or 1 ounces of hard liquor.  Do not use street drugs.  Do not share needles.  Ask your health care provider for help if you need support or information about quitting drugs.  Tell your health care provider if you often feel depressed.  Tell your health care provider if you have ever been abused or do not feel safe at home. This information is not intended to replace advice given to you by your health care provider. Make sure you discuss any questions you have with your health care provider. Document Released: 11/29/2010 Document Revised: 10/22/2015 Document Reviewed: 02/17/2015 Elsevier Interactive Patient Education  2018 Elsevier Inc.  

## 2017-07-07 LAB — TSH: TSH: 1.11 u[IU]/mL (ref 0.450–4.500)

## 2017-07-07 LAB — BASIC METABOLIC PANEL
BUN / CREAT RATIO: 13 (ref 9–23)
BUN: 9 mg/dL (ref 6–20)
CHLORIDE: 106 mmol/L (ref 96–106)
CO2: 24 mmol/L (ref 20–29)
Calcium: 8.9 mg/dL (ref 8.7–10.2)
Creatinine, Ser: 0.7 mg/dL (ref 0.57–1.00)
GFR calc non Af Amer: 118 mL/min/{1.73_m2} (ref >59)
GFR, EST AFRICAN AMERICAN: 136 mL/min/{1.73_m2} (ref >59)
Glucose: 81 mg/dL (ref 65–99)
Potassium: 4.3 mmol/L (ref 3.5–5.2)
SODIUM: 141 mmol/L (ref 134–144)

## 2017-07-07 LAB — CBC
Hematocrit: 33.8 % — ABNORMAL LOW (ref 34.0–46.6)
Hemoglobin: 11.1 g/dL (ref 11.1–15.9)
MCH: 29.8 pg (ref 26.6–33.0)
MCHC: 32.8 g/dL (ref 31.5–35.7)
MCV: 91 fL (ref 79–97)
PLATELETS: 187 10*3/uL (ref 150–379)
RBC: 3.73 x10E6/uL — ABNORMAL LOW (ref 3.77–5.28)
RDW: 13.3 % (ref 12.3–15.4)
WBC: 4.9 10*3/uL (ref 3.4–10.8)

## 2017-07-08 ENCOUNTER — Encounter: Payer: Self-pay | Admitting: Family Medicine

## 2017-07-08 DIAGNOSIS — F411 Generalized anxiety disorder: Secondary | ICD-10-CM | POA: Insufficient documentation

## 2017-07-11 NOTE — Progress Notes (Deleted)
  No chief complaint on file.   HPI  4 review of systems  Past Medical History:  Diagnosis Date  . Anemia   . WUJWJXBJ(478.2Headache(784.0)     Current Outpatient Medications  Medication Sig Dispense Refill  . albuterol (PROVENTIL HFA;VENTOLIN HFA) 108 (90 Base) MCG/ACT inhaler Inhale 2 puffs into the lungs every 4 (four) hours as needed for wheezing or shortness of breath. (Patient not taking: Reported on 06/03/2017) 1 Inhaler 0  . ferrous sulfate 325 (65 FE) MG tablet Take 325 mg by mouth daily with breakfast.    . ipratropium (ATROVENT) 0.06 % nasal spray Place 2 sprays into both nostrils 4 (four) times daily. (Patient not taking: Reported on 06/03/2017) 15 mL 0  . predniSONE (DELTASONE) 50 MG tablet 1 tab po daily for 6 days. Take with food. (Patient not taking: Reported on 06/03/2017) 6 tablet 0   No current facility-administered medications for this visit.     Allergies:  Allergies  Allergen Reactions  . Latex Itching    Past Surgical History:  Procedure Laterality Date  . WISDOM TOOTH EXTRACTION      Social History   Socioeconomic History  . Marital status: Single    Spouse name: Not on file  . Number of children: Not on file  . Years of education: Not on file  . Highest education level: Not on file  Social Needs  . Financial resource strain: Not on file  . Food insecurity - worry: Not on file  . Food insecurity - inability: Not on file  . Transportation needs - medical: Not on file  . Transportation needs - non-medical: Not on file  Occupational History  . Not on file  Tobacco Use  . Smoking status: Never Smoker  . Smokeless tobacco: Never Used  Substance and Sexual Activity  . Alcohol use: Yes    Comment: occ  . Drug use: No  . Sexual activity: Not Currently    Birth control/protection: None  Other Topics Concern  . Not on file  Social History Narrative  . Not on file    Family History  Problem Relation Age of Onset  . Cancer Paternal Aunt   . Other Neg Hx       ROS Review of Systems See HPI Constitution: No fevers or chills No malaise No diaphoresis Skin: No rash or itching Eyes: no blurry vision, no double vision GU: no dysuria or hematuria Neuro: no dizziness or headaches * all others reviewed and negative   Objective: There were no vitals filed for this visit.  Physical Exam  Assessment and Plan There are no diagnoses linked to this encounter.   Jamael Hoffmann P [employer]

## 2017-07-12 ENCOUNTER — Ambulatory Visit: Payer: No Typology Code available for payment source | Admitting: Family Medicine

## 2017-07-13 ENCOUNTER — Other Ambulatory Visit: Payer: Self-pay

## 2017-07-13 ENCOUNTER — Encounter: Payer: Self-pay | Admitting: Family Medicine

## 2017-07-13 ENCOUNTER — Ambulatory Visit (INDEPENDENT_AMBULATORY_CARE_PROVIDER_SITE_OTHER): Payer: No Typology Code available for payment source | Admitting: Family Medicine

## 2017-07-13 VITALS — BP 98/68 | HR 73 | Temp 98.5°F | Resp 16 | Ht 69.75 in | Wt 151.0 lb

## 2017-07-13 DIAGNOSIS — Z124 Encounter for screening for malignant neoplasm of cervix: Secondary | ICD-10-CM | POA: Diagnosis not present

## 2017-07-13 NOTE — Patient Instructions (Signed)
     IF you received an x-ray today, you will receive an invoice from [facility]. Please contact [facility] at [phone] with questions or concerns regarding your invoice.   IF you received labwork today, you will receive an invoice from [facility]. Please contact [facility] at [phone] with questions or concerns regarding your invoice.   Our billing staff will not be able to assist you with questions regarding bills from these companies.  You will be contacted with the lab results as soon as they are available. The fastest way to get your results is to activate your My Chart account. Instructions are located on the last page of this paperwork. If you have not heard from us regarding the results in 2 weeks, please contact this office.     

## 2017-07-13 NOTE — Progress Notes (Signed)
  Chief Complaint  Patient presents with  . Gynecologic Exam    follow up 4 weeks for pap smear    HPI   Pt returns for pap smear She denies history of abnormal pap smear She had to reschedule due to her menstrual cycle during her physical.    Past Medical History:  Diagnosis Date  . Anemia   . ZOXWRUEA(540.9Headache(784.0)     Current Outpatient Medications  Medication Sig Dispense Refill  . albuterol (PROVENTIL HFA;VENTOLIN HFA) 108 (90 Base) MCG/ACT inhaler Inhale 2 puffs into the lungs every 4 (four) hours as needed for wheezing or shortness of breath. (Patient not taking: Reported on 06/03/2017) 1 Inhaler 0  . ferrous sulfate 325 (65 FE) MG tablet Take 325 mg by mouth daily with breakfast.    . ipratropium (ATROVENT) 0.06 % nasal spray Place 2 sprays into both nostrils 4 (four) times daily. (Patient not taking: Reported on 06/03/2017) 15 mL 0  . predniSONE (DELTASONE) 50 MG tablet 1 tab po daily for 6 days. Take with food. (Patient not taking: Reported on 06/03/2017) 6 tablet 0   No current facility-administered medications for this visit.     Allergies:  Allergies  Allergen Reactions  . Latex Itching    Past Surgical History:  Procedure Laterality Date  . WISDOM TOOTH EXTRACTION      Social History   Socioeconomic History  . Marital status: Single    Spouse name: None  . Number of children: None  . Years of education: None  . Highest education level: None  Social Needs  . Financial resource strain: None  . Food insecurity - worry: None  . Food insecurity - inability: None  . Transportation needs - medical: None  . Transportation needs - non-medical: None  Occupational History  . None  Tobacco Use  . Smoking status: Never Smoker  . Smokeless tobacco: Never Used  Substance and Sexual Activity  . Alcohol use: Yes    Comment: occ  . Drug use: No  . Sexual activity: Not Currently    Birth control/protection: None  Other Topics Concern  . None  Social History Narrative    . None    Family History  Problem Relation Age of Onset  . Cancer Paternal Aunt   . Other Neg Hx      ROS Review of Systems See HPI Constitution: No fevers or chills No malaise No diaphoresis Skin: No rash or itching Eyes: no blurry vision, no double vision GU: no dysuria or hematuria Neuro: no dizziness or headaches all others reviewed and negative   Objective: Vitals:   07/13/17 1627  BP: 98/68  Pulse: 73  Resp: 16  Temp: 98.5 F (36.9 C)  TempSrc: Oral  SpO2: 100%  Weight: 151 lb (68.5 kg)  Height: 5' 9.75" (1.772 m)    Physical Exam Chaperone present Vaginal exam Labia normal bilaterally without skin lesions Urethral meatus normal appearing without erythema Vagina without discharge No CMT, ovaries small and not palpable Uterus midline, nontender Pap smear performed  Assessment and Plan Tracey Whitney was seen today for gynecologic exam.  Diagnoses and all orders for this visit:  Screening for cervical cancer -     Pap IG, CT/NG NAA, and HPV (high risk)   Discussed pap guidelines. Pap smear performed  Tracey Whitney

## 2017-07-17 LAB — PAP IG, CT-NG NAA, HPV HIGH-RISK
Chlamydia, Nuc. Acid Amp: NEGATIVE
Gonococcus by Nucleic Acid Amp: NEGATIVE
HPV, high-risk: NEGATIVE
PAP Smear Comment: 0

## 2017-07-18 ENCOUNTER — Encounter: Payer: Self-pay | Admitting: Family Medicine

## 2017-09-07 ENCOUNTER — Telehealth: Payer: Self-pay | Admitting: Family Medicine

## 2017-09-07 NOTE — Telephone Encounter (Signed)
Copied from CRM [id number]. Topic: Quick Communication - Rx Refill/Question >> Sep 07, 2017  3:45 PM Crist InfanteHarrald, Kathy J wrote: Medication: a birth control Pt saw dr on 2/14 and discussed getting a birth control.  Pt wants to know if the dr will call one in, or does she need to make an appt? [facility] [place] - [address] [phone] (Phone) [phone] (Fax)

## 2017-09-08 NOTE — Telephone Encounter (Signed)
Provider, please prescribe medication if appropriate or advise she be seen for visit.

## 2017-09-12 MED ORDER — NORGESTIM-ETH ESTRAD TRIPHASIC 0.18/0.215/0.25 MG-35 MCG PO TABS: 1.0000 | ORAL_TABLET | Freq: Every day | ORAL | 11 refills | Status: DC

## 2017-09-12 NOTE — Telephone Encounter (Signed)
Please let the patient know that I have sent in a combination pill for her contraception to [facility]. She should start the pill on the first Sunday of her next period.

## 2017-09-13 NOTE — Telephone Encounter (Signed)
Phone call to patient. Per signed authorization to leave detailed message, left voicemail stating message from provider below, please call back if questions.

## 2017-11-13 MED FILL — SERTRALINE HCL 50 MG TABLET: 50 | 30 days supply | Qty: 30 | Fill #0

## 2018-03-16 ENCOUNTER — Telehealth: Payer: No Typology Code available for payment source | Admitting: Family

## 2018-03-16 DIAGNOSIS — Z719 Counseling, unspecified: Secondary | ICD-10-CM

## 2018-03-16 NOTE — Progress Notes (Signed)
Thank you for the details you included in the comment boxes. Those details are very helpful in determining the best course of treatment for you and help [place] to provide the best care. With this much pain and going into your face also, a physical exam would be ideal.  Based on what you shared with me it looks like you have a serious condition that should be evaluated in a face to face office visit.  NOTE: If you entered your credit card information for this eVisit, you will not be charged. You may see a "hold" on your card for the $30 but that hold will drop off and you will not have a charge processed.  If you are having a true medical emergency please call 911.  If you need an urgent face to face visit, [place] has four urgent care centers for your convenience.  If you need care fast and have a high deductible or no insurance consider:   [email or web address] to reserve your spot online an avoid wait times  [facility] [address] [place], [place] [address] 8 am to 8 pm Monday-Friday 10 am to 4 pm Saturday-Sunday *Across the street from [employer]  [address] [place], [address] 8 am to 5 pm Monday-Friday * In the [facility] on the [facility]   The following sites will take your  insurance:  . [facility]  [phone] Get Driving Directions Find a Provider at this Location  [address] [place], [place] [address] . 10 am to 8 pm Monday-Friday . 12 pm to 8 pm Saturday-Sunday   . [facility] at [facility]  [phone] Get Driving Directions Find a Provider at this Location  [address] [place], [place] [address] . 8 am to 8 pm Monday-Friday . 9 am to 6 pm Saturday . 11 am to 6 pm Sunday   . [facility] at [facility]  [phone] Get Driving Directions  [address] [place], [place] [address] . 8 am  to 8 pm Monday-Friday . 8 am to 4 pm Saturday-Sunday   Your e-visit answers were reviewed by a board certified advanced clinical practitioner to complete your personal care plan.  Thank you for using e-Visits.

## 2018-03-21 ENCOUNTER — Inpatient Hospital Stay (HOSPITAL_COMMUNITY)
Admission: AD | Admit: 2018-03-21 | Discharge: 2018-03-21 | Disposition: A | Discharge status: Home / Self Care | Payer: No Typology Code available for payment source | Source: Ambulatory Visit | Attending: Obstetrics & Gynecology | Admitting: Obstetrics & Gynecology

## 2018-03-21 ENCOUNTER — Inpatient Hospital Stay (HOSPITAL_COMMUNITY): Payer: No Typology Code available for payment source

## 2018-03-21 ENCOUNTER — Encounter (HOSPITAL_COMMUNITY): Payer: Self-pay | Admitting: *Deleted

## 2018-03-21 DIAGNOSIS — O3680X0 Pregnancy with inconclusive fetal viability, not applicable or unspecified: Secondary | ICD-10-CM

## 2018-03-21 DIAGNOSIS — O209 Hemorrhage in early pregnancy, unspecified: Secondary | ICD-10-CM | POA: Diagnosis not present

## 2018-03-21 LAB — URINALYSIS, ROUTINE W REFLEX MICROSCOPIC
BACTERIA UA: NONE SEEN
Bilirubin Urine: NEGATIVE
Glucose, UA: NEGATIVE mg/dL
KETONES UR: NEGATIVE mg/dL
Leukocytes, UA: NEGATIVE
Nitrite: NEGATIVE
PH: 7 (ref 5.0–8.0)
Protein, ur: NEGATIVE mg/dL
RBC / HPF: >50 RBC/hpf — ABNORMAL HIGH (ref 0–5)
SPECIFIC GRAVITY, URINE: 1.016 (ref 1.005–1.030)

## 2018-03-21 LAB — CBC
HEMATOCRIT: 30 % — AB (ref 36.0–46.0)
Hemoglobin: 10.4 g/dL — ABNORMAL LOW (ref 12.0–15.0)
MCH: 30.8 pg (ref 26.0–34.0)
MCHC: 34.7 g/dL (ref 30.0–36.0)
MCV: 88.8 fL (ref 80.0–100.0)
NRBC: 0 % (ref 0.0–0.2)
Platelets: 174 10*3/uL (ref 150–400)
RBC: 3.38 MIL/uL — AB (ref 3.87–5.11)
RDW: 12.4 % (ref 11.5–15.5)
WBC: 3.3 10*3/uL — AB (ref 4.0–10.5)

## 2018-03-21 LAB — WET PREP, GENITAL
Clue Cells Wet Prep HPF POC: NONE SEEN
Sperm: NONE SEEN
Trich, Wet Prep: NONE SEEN
Yeast Wet Prep HPF POC: NONE SEEN

## 2018-03-21 LAB — HCG, QUANTITATIVE, PREGNANCY: hCG, Beta Chain, Quant, S: 584 m[IU]/mL — ABNORMAL HIGH (ref <5)

## 2018-03-21 LAB — POCT PREGNANCY, URINE: PREG TEST UR: POSITIVE — AB

## 2018-03-21 NOTE — MAU Note (Signed)
Pt presents to MAU c/o vaginal bleeding and passing clots. Pt states that last Wednesday she started having some spotting and she was 3days later for her cycle so she presumed it was the cycle beginning, since then the pt reports vaginal bleeding and passing clots. Yesterday the pt reports after taking a tampon out a large hard "clot" came out. Pt was unsure if it was just blood or what so she took the sample to her mother who is a Engineer, civil (consulting) and today just wants to be evaluated for the problem. Pt is unsure if she is pregnant and is concerned about bleeding this long.

## 2018-03-21 NOTE — MAU Provider Note (Signed)
Chief Complaint: Back Pain and Vaginal Bleeding   First Provider Initiated Contact with Patient 03/21/18 2058        SUBJECTIVE HPI: Tracey Whitney is a 27 y.o. [id number] at Unknown by LMP who presents to maternity admissions reporting irregular bleeding and passage of clots.  States did take a morning after pill about 4 weeks ago after a sexual encounter.  Not on any contraception currently.. She denies vaginal itching/burning, urinary symptoms, h/a, dizziness, n/v, or fever/chills.    Back Pain  This is a new problem. The current episode started in the past 7 days. The problem is unchanged. The pain is present in the lumbar spine. Associated symptoms include abdominal pain and pelvic pain. Pertinent negatives include no dysuria, headaches, paresis or paresthesias.  Vaginal Bleeding  The patient's primary symptoms include pelvic pain and vaginal bleeding. The patient's pertinent negatives include no genital itching, genital lesions or genital odor. This is a new problem. The current episode started 1 to 4 weeks ago. The problem occurs intermittently. The problem has been unchanged. Associated symptoms include abdominal pain and back pain. Pertinent negatives include no constipation, diarrhea, dysuria, headaches, nausea or vomiting. The vaginal discharge was bloody. The vaginal bleeding is lighter than menses. She has been passing clots. Nothing aggravates the symptoms. She has tried nothing for the symptoms.   RN Note:  Pt presents to MAU c/o vaginal bleeding and passing clots. Pt states that last Wednesday she started having some spotting and she was 3days later for her cycle so she presumed it was the cycle beginning, since then the pt reports vaginal bleeding and passing clots. Yesterday the pt reports after taking a tampon out a large hard "clot" came out. Pt was unsure if it was just blood or what so she took the sample to her mother who is a Engineer, civil (consulting) and today just wants to be evaluated for the  problem. Pt is unsure if she is pregnant and is concerned about bleeding this long.   Past Medical History:  Diagnosis Date  . Anemia   . [id number](914.7)    Past Surgical History:  Procedure Laterality Date  . WISDOM TOOTH EXTRACTION     Social History   Socioeconomic History  . Marital status: Single    Spouse name: Not on file  . Number of children: Not on file  . Years of education: Not on file  . Highest education level: Not on file  Occupational History  . Not on file  Social Needs  . Financial resource strain: Not on file  . Food insecurity:    Worry: Not on file    Inability: Not on file  . Transportation needs:    Medical: Not on file    Non-medical: Not on file  Tobacco Use  . Smoking status: Never Smoker  . Smokeless tobacco: Never Used  Substance and Sexual Activity  . Alcohol use: Yes    Comment: occ  . Drug use: No  . Sexual activity: Not Currently    Birth control/protection: None  Lifestyle  . Physical activity:    Days per week: Not on file    Minutes per session: Not on file  . Stress: Not on file  Relationships  . Social connections:    Talks on phone: Not on file    Gets together: Not on file    Attends religious service: Not on file    Active member of club or organization: Not on file    Attends meetings  of clubs or organizations: Not on file    Relationship status: Not on file  . Intimate partner violence:    Fear of current or ex partner: Not on file    Emotionally abused: Not on file    Physically abused: Not on file    Forced sexual activity: Not on file  Other Topics Concern  . Not on file  Social History Narrative  . Not on file   No current facility-administered medications on file prior to encounter.    Current Outpatient Medications on File Prior to Encounter  Medication Sig Dispense Refill  . amoxicillin-clavulanate (AUGMENTIN) 500-125 MG tablet Take 1 tablet by mouth 3 (three) times daily.    Marland Kitchen ibuprofen (ADVIL,MOTRIN)  800 MG tablet Take 800 mg by mouth every 8 (eight) hours as needed.    Marland Kitchen albuterol (PROVENTIL HFA;VENTOLIN HFA) 108 (90 Base) MCG/ACT inhaler Inhale 2 puffs into the lungs every 4 (four) hours as needed for wheezing or shortness of breath. (Patient not taking: Reported on 06/03/2017) 1 Inhaler 0  . ferrous sulfate 325 (65 FE) MG tablet Take 325 mg by mouth daily with breakfast.    . ipratropium (ATROVENT) 0.06 % nasal spray Place 2 sprays into both nostrils 4 (four) times daily. (Patient not taking: Reported on 06/03/2017) 15 mL 0  . Norgestimate-Ethinyl Estradiol Triphasic (ORTHO TRI-CYCLEN, 28,) 0.18/0.215/0.25 MG-35 MCG tablet Take 1 tablet by mouth daily. 1 Package 11  . predniSONE (DELTASONE) 50 MG tablet 1 tab po daily for 6 days. Take with food. (Patient not taking: Reported on 06/03/2017) 6 tablet 0   Allergies  Allergen Reactions  . Latex Itching    I have reviewed patient's Past Medical Hx, Surgical Hx, Family Hx, Social Hx, medications and allergies.   ROS:  Review of Systems  Gastrointestinal: Positive for abdominal pain. Negative for constipation, diarrhea, nausea and vomiting.  Genitourinary: Positive for pelvic pain and vaginal bleeding. Negative for dysuria.  Musculoskeletal: Positive for back pain.  Neurological: Negative for headaches and paresthesias.   Review of Systems  Other systems negative   Physical Exam  Physical Exam Patient Vitals for the past 24 hrs:  BP Temp Temp src Pulse Resp Height Weight  03/21/18 2038 120/75 98.3 F (36.8 C) Oral 74 17 5\' 9"  (1.753 m) 68.5 kg   Constitutional: Well-developed, well-nourished female in no acute distress.  Cardiovascular: normal rate Respiratory: normal effort GI: Abd soft, non-tender. Pos BS x 4 MS: Extremities nontender, no edema, normal ROM Neurologic: Alert and oriented x 4.  GU: Neg CVAT.  PELVIC EXAM: Cervix pink, visually slightly open external os, without lesion, scant red discharge, vaginal walls and external  genitalia normal Bimanual exam: Cervix 0/long/high, firm, anterior, neg CMT, uterus nontender, nonenlarged, adnexa without tenderness, enlargement, or mass   LAB RESULTS Results for orders placed or performed during the hospital encounter of 03/21/18 (from the past 24 hour(s))  Urinalysis, Routine w reflex microscopic     Status: Abnormal   Collection Time: 03/21/18  8:48 PM  Result Value Ref Range   Color, Urine YELLOW YELLOW   APPearance CLEAR CLEAR   Specific Gravity, Urine 1.016 1.005 - 1.030   pH 7.0 5.0 - 8.0   Glucose, UA NEGATIVE NEGATIVE mg/dL   Hgb urine dipstick MODERATE (A) NEGATIVE   Bilirubin Urine NEGATIVE NEGATIVE   Ketones, ur NEGATIVE NEGATIVE mg/dL   Protein, ur NEGATIVE NEGATIVE mg/dL   Nitrite NEGATIVE NEGATIVE   Leukocytes, UA NEGATIVE NEGATIVE   RBC / HPF >  50 (H) 0 - 5 RBC/hpf   WBC, UA 0-5 0 - 5 WBC/hpf   Bacteria, UA NONE SEEN NONE SEEN   Squamous Epithelial / LPF 0-5 0 - 5   Mucus PRESENT   Pregnancy, urine POC     Status: Abnormal   Collection Time: 03/21/18  8:50 PM  Result Value Ref Range   Preg Test, Ur POSITIVE (A) NEGATIVE  Wet prep, genital     Status: Abnormal   Collection Time: 03/21/18  9:11 PM  Result Value Ref Range   Yeast Wet Prep HPF POC NONE SEEN NONE SEEN   Trich, Wet Prep NONE SEEN NONE SEEN   Clue Cells Wet Prep HPF POC NONE SEEN NONE SEEN   WBC, Wet Prep HPF POC FEW (A) NONE SEEN   Sperm NONE SEEN   CBC     Status: Abnormal   Collection Time: 03/21/18  9:13 PM  Result Value Ref Range   WBC 3.3 (L) 4.0 - 10.5 K/uL   RBC 3.38 (L) 3.87 - 5.11 MIL/uL   Hemoglobin 10.4 (L) 12.0 - 15.0 g/dL   HCT 40.9 (L) 81.1 - 91.4 %   MCV 88.8 80.0 - 100.0 fL   MCH 30.8 26.0 - 34.0 pg   MCHC 34.7 30.0 - 36.0 g/dL   RDW 78.2 95.6 - 21.3 %   Platelets 174 150 - 400 K/uL   nRBC 0.0 0.0 - 0.2 %  hCG, quantitative, pregnancy     Status: Abnormal   Collection Time: 03/21/18  9:13 PM  Result Value Ref Range   hCG, Beta Chain, Quant, S 584 (H) <5  mIU/mL    IMAGING US Ob Comp Less 14 Wks  Result Date: 03/21/2018 CLINICAL DATA:  Bleeding EXAM: OBSTETRIC <14 WK [place] AND TRANSVAGINAL OB US TECHNIQUE: Both transabdominal and transvaginal ultrasound examinations were performed for complete evaluation of the gestation as well as the maternal uterus, adnexal regions, and pelvic cul-de-sac. Transvaginal technique was performed to assess early pregnancy. COMPARISON:  None. FINDINGS: Intrauterine gestational sac: None visualized Yolk sac:  Not visualized Embryo:  Not visualized Cardiac Activity: Heart Rate:   bpm MSD:   mm    w     d CRL:    mm    w    d                  [place] EDC: Subchorionic hemorrhage:  None visualized. Maternal uterus/adnexae: No adnexal mass or free fluid. IMPRESSION: No intrauterine pregnancy visualized. Differential considerations would include early intrauterine pregnancy too early to visualize, spontaneous abortion, or occult ectopic pregnancy. Recommend close clinical followup and serial quantitative beta HCGs and ultrasounds. Electronically Signed   By: Charlett Nose M.D.   On: 03/21/2018 22:14   US Ob Transvaginal  Result Date: 03/21/2018 CLINICAL DATA:  Bleeding EXAM: OBSTETRIC <14 WK [place] AND TRANSVAGINAL OB US TECHNIQUE: Both transabdominal and transvaginal ultrasound examinations were performed for complete evaluation of the gestation as well as the maternal uterus, adnexal regions, and pelvic cul-de-sac. Transvaginal technique was performed to assess early pregnancy. COMPARISON:  None. FINDINGS: Intrauterine gestational sac: None visualized Yolk sac:  Not visualized Embryo:  Not visualized Cardiac Activity: Heart Rate:   bpm MSD:   mm    w     d CRL:    mm    w    d                  [place] EDC: Subchorionic hemorrhage:  None visualized. Maternal uterus/adnexae: No adnexal mass or free fluid. IMPRESSION: No intrauterine pregnancy visualized. Differential considerations would include early intrauterine pregnancy too early to visualize,  spontaneous abortion, or occult ectopic pregnancy. Recommend close clinical followup and serial quantitative beta HCGs and ultrasounds. Electronically Signed   By: Charlett Nose M.D.   On: 03/21/2018 22:14     MAU Management/MDM: Ordered usual first trimester r/o ectopic labs.   Pelvic exam and cultures done Will check baseline Ultrasound to rule out ectopic.  This bleeding/pain can represent a normal pregnancy with bleeding, spontaneous abortion or even an ectopic which can be life-threatening.  The process as listed above helps to determine which of these is present.  Reviewed results which are inconclusive.  We cannot definitively rule out ectopic pregnancy yet.  We also cannot determine viability at this time.  Discussed we need to repeat HCG and then repeat US if it doubles appropriately     ASSESSMENT Pregnancy at early stage, unknown LMP Bleeding in pregnancy Pregnancy of unknown location or viability  PLAN Discharge home Plan to repeat HCG level in 48 hours in MAU Will repeat  Ultrasound in about 7-10 days if HCG levels double appropriately  Ectopic precautions  Pt stable at time of discharge. Encouraged to return here or to other Urgent Care/ED if she develops worsening of symptoms, increase in pain, fever, or other concerning symptoms.    Wynelle Bourgeois CNM, MSN Certified Nurse-Midwife 03/21/2018  8:58 PM

## 2018-03-21 NOTE — Discharge Instructions (Signed)
Vaginal Bleeding During Pregnancy, First Trimester °A small amount of bleeding (spotting) from the vagina is common in early pregnancy. Sometimes the bleeding is normal and is not a problem, and sometimes it is a sign of something serious. Be sure to tell your doctor about any bleeding from your vagina right away. °Follow these instructions at home: °· Watch your condition for any changes. °· Follow your doctor's instructions about how active you can be. °· If you are on bed rest: °? You may need to stay in bed and only get up to use the bathroom. °? You may be allowed to do some activities. °? If you need help, make plans for someone to help you. °· Write down: °? The number of pads you use each day. °? How often you change pads. °? How soaked (saturated) your pads are. °· Do not use tampons. °· Do not douche. °· Do not have sex or orgasms until your doctor says it is okay. °· If you pass any tissue from your vagina, save the tissue so you can show it to your doctor. °· Only take medicines as told by your doctor. °· Do not take aspirin because it can make you bleed. °· Keep all follow-up visits as told by your doctor. °Contact a doctor if: °· You bleed from your vagina. °· You have cramps. °· You have labor pains. °· You have a fever that does not go away after you take medicine. °Get help right away if: °· You have very bad cramps in your back or belly (abdomen). °· You pass large clots or tissue from your vagina. °· You bleed more. °· You feel light-headed or weak. °· You pass out (faint). °· You have chills. °· You are leaking fluid or have a gush of fluid from your vagina. °· You pass out while pooping (having a bowel movement). °This information is not intended to replace advice given to you by your health care provider. Make sure you discuss any questions you have with your health care provider. °Document Released: 09/30/2013 Document Revised: 10/22/2015 Document Reviewed: 01/21/2013 °Elsevier Interactive  Patient Education © 2018 [employer] ° °

## 2018-03-22 LAB — HIV ANTIBODY (ROUTINE TESTING W REFLEX): HIV Screen 4th Generation wRfx: NONREACTIVE

## 2018-03-22 LAB — GC/CHLAMYDIA PROBE AMP (~~LOC~~) NOT AT ARMC
CHLAMYDIA, DNA PROBE: NEGATIVE
NEISSERIA GONORRHEA: NEGATIVE

## 2018-04-09 ENCOUNTER — Inpatient Hospital Stay (HOSPITAL_COMMUNITY): Payer: No Typology Code available for payment source

## 2018-04-09 ENCOUNTER — Encounter (HOSPITAL_COMMUNITY): Payer: Self-pay | Admitting: Emergency Medicine

## 2018-04-09 ENCOUNTER — Inpatient Hospital Stay (HOSPITAL_COMMUNITY)
Admission: AD | Admit: 2018-04-09 | Discharge: 2018-04-10 | Disposition: A | Discharge status: Home / Self Care | Payer: No Typology Code available for payment source | Source: Ambulatory Visit | Attending: Obstetrics and Gynecology | Admitting: Obstetrics and Gynecology

## 2018-04-09 ENCOUNTER — Other Ambulatory Visit: Payer: Self-pay

## 2018-04-09 DIAGNOSIS — O039 Complete or unspecified spontaneous abortion without complication: Secondary | ICD-10-CM

## 2018-04-09 DIAGNOSIS — O26891 Other specified pregnancy related conditions, first trimester: Secondary | ICD-10-CM

## 2018-04-09 DIAGNOSIS — O209 Hemorrhage in early pregnancy, unspecified: Secondary | ICD-10-CM

## 2018-04-09 DIAGNOSIS — R109 Unspecified abdominal pain: Secondary | ICD-10-CM

## 2018-04-09 LAB — CBC
HEMATOCRIT: 34.7 % — AB (ref 36.0–46.0)
Hemoglobin: 11.8 g/dL — ABNORMAL LOW (ref 12.0–15.0)
MCH: 30.4 pg (ref 26.0–34.0)
MCHC: 34 g/dL (ref 30.0–36.0)
MCV: 89.4 fL (ref 80.0–100.0)
Platelets: 154 10*3/uL (ref 150–400)
RBC: 3.88 MIL/uL (ref 3.87–5.11)
RDW: 12.6 % (ref 11.5–15.5)
WBC: 3.6 10*3/uL — AB (ref 4.0–10.5)
nRBC: 0 % (ref 0.0–0.2)

## 2018-04-09 LAB — URINALYSIS, ROUTINE W REFLEX MICROSCOPIC
BACTERIA UA: NONE SEEN
BILIRUBIN URINE: NEGATIVE
Glucose, UA: NEGATIVE mg/dL
Ketones, ur: NEGATIVE mg/dL
LEUKOCYTES UA: NEGATIVE
NITRITE: NEGATIVE
PROTEIN: NEGATIVE mg/dL
SPECIFIC GRAVITY, URINE: 1.013 (ref 1.005–1.030)
pH: 8 (ref 5.0–8.0)

## 2018-04-09 LAB — HCG, QUANTITATIVE, PREGNANCY: hCG, Beta Chain, Quant, S: 3 m[IU]/mL (ref <5)

## 2018-04-09 NOTE — MAU Note (Signed)
Pt reports vaginal spotting that started this morning that started out pink to brown and now red. States she is only wearing a panty liner. Pt reports LLQ pain, crampy in nature. Pt has not tried anything for pain.

## 2018-04-09 NOTE — MAU Provider Note (Signed)
Chief Complaint: Vaginal Bleeding   First Provider Initiated Contact with Patient 04/09/18 2241     SUBJECTIVE HPI: Tracey Whitney is a 27 y.o. [id number] at Unknown gestation who presents to Maternity Admissions reporting vaginal bleeding & abdominal cramping. Was seen in MAU 10/23. Had HCG of 584 & no IUP on ultrasound. Went to her PCP the next day & had HCG of 502 (per care everywhere). States she has new OB scheduled at [facility] on 11/19 (has never been seen there before).  Bleeding started this morning. Initially was pink spotting but has become darker & heavier throughout the day. Has to wear a pad. Not saturating pads or passing clots.   Location: lower abdomen Quality: cramping Severity: 7/10 on pain scale Duration:  1 day Timing: constant Modifying factors: none Associated signs and symptoms: vaginal bleeding  Past Medical History:  Diagnosis Date  . Anemia   . Headache(784.0)    OB History  Gravida Para Term Preterm AB Living  4 2 2   1 2   SAB TAB Ectopic Multiple Live Births  1       2    # Outcome Date GA Lbr Len/2nd Weight Sex Delivery Anes PTL Lv  4 Current           3 Term 02/23/12 [redacted]w[redacted]d 20:31 / 01:27 4116 g F Vag-Spont Local, EPI  LIV  2 Term 07/05/09 [redacted]w[redacted]d  3827 g F Vag-Spont EPI  LIV  1 SAB            Past Surgical History:  Procedure Laterality Date  . WISDOM TOOTH EXTRACTION     Social History   Socioeconomic History  . Marital status: Single    Spouse name: Not on file  . Number of children: Not on file  . Years of education: Not on file  . Highest education level: Not on file  Occupational History  . Not on file  Social Needs  . Financial resource strain: Not on file  . Food insecurity:    Worry: Not on file    Inability: Not on file  . Transportation needs:    Medical: Not on file    Non-medical: Not on file  Tobacco Use  . Smoking status: Never Smoker  . Smokeless tobacco: Never Used  Substance and Sexual Activity  . Alcohol use:  Yes    Comment: occ  . Drug use: No  . Sexual activity: Not Currently    Birth control/protection: None  Lifestyle  . Physical activity:    Days per week: Not on file    Minutes per session: Not on file  . Stress: Not on file  Relationships  . Social connections:    Talks on phone: Not on file    Gets together: Not on file    Attends religious service: Not on file    Active member of club or organization: Not on file    Attends meetings of clubs or organizations: Not on file    Relationship status: Not on file  . Intimate partner violence:    Fear of current or ex partner: Not on file    Emotionally abused: Not on file    Physically abused: Not on file    Forced sexual activity: Not on file  Other Topics Concern  . Not on file  Social History Narrative  . Not on file   Family History  Problem Relation Age of Onset  . Cancer Paternal Aunt   . Other Neg  Hx    No current facility-administered medications on file prior to encounter.    Current Outpatient Medications on File Prior to Encounter  Medication Sig Dispense Refill  . prenatal vitamin w/FE, FA (PRENATAL 1 + 1) 27-1 MG TABS tablet Take 1 tablet by mouth daily at 12 noon.    . sertraline (ZOLOFT) 50 MG tablet Take 50 mg by mouth daily.     Allergies  Allergen Reactions  . Latex Itching    I have reviewed patient's Past Medical Hx, Surgical Hx, Family Hx, Social Hx, medications and allergies.   Review of Systems  Constitutional: Negative.   Gastrointestinal: Positive for abdominal pain. Negative for constipation, diarrhea, nausea and vomiting.  Genitourinary: Positive for vaginal bleeding.    OBJECTIVE Patient Vitals for the past 24 hrs:  BP Temp Pulse Resp SpO2 Height Weight  04/10/18 0013 118/88 - 70 18 - - -  04/09/18 2203 109/77 97.9 F (36.6 C) 62 18 96 % 5\' 9"  (1.753 m) 66.7 kg   Constitutional: Well-developed, well-nourished female in no acute distress.  Cardiovascular: normal rate & rhythm, no  murmur Respiratory: normal rate and effort. Lung sounds clear throughout GI: Abd soft, non-tender, Pos BS x 4. No guarding or rebound tenderness MS: Extremities nontender, no edema, normal ROM Neurologic: Alert and oriented x 4.  GU:     SPECULUM EXAM: NEFG, small amount of dark red blood. Cervix pink & smooth.   BIMANUAL: No CMT. cervix closed; uterus normal size, no adnexal tenderness or masses.    LAB RESULTS Results for orders placed or performed during the hospital encounter of 04/09/18 (from the past 24 hour(s))  Urinalysis, Routine w reflex microscopic     Status: Abnormal   Collection Time: 04/09/18 10:30 PM  Result Value Ref Range   Color, Urine YELLOW YELLOW   APPearance CLEAR CLEAR   Specific Gravity, Urine 1.013 1.005 - 1.030   pH 8.0 5.0 - 8.0   Glucose, UA NEGATIVE NEGATIVE mg/dL   Hgb urine dipstick MODERATE (A) NEGATIVE   Bilirubin Urine NEGATIVE NEGATIVE   Ketones, ur NEGATIVE NEGATIVE mg/dL   Protein, ur NEGATIVE NEGATIVE mg/dL   Nitrite NEGATIVE NEGATIVE   Leukocytes, UA NEGATIVE NEGATIVE   RBC / HPF 11-20 0 - 5 RBC/hpf   WBC, UA 0-5 0 - 5 WBC/hpf   Bacteria, UA NONE SEEN NONE SEEN   Squamous Epithelial / LPF 0-5 0 - 5   Mucus PRESENT   CBC     Status: Abnormal   Collection Time: 04/09/18 10:55 PM  Result Value Ref Range   WBC 3.6 (L) 4.0 - 10.5 K/uL   RBC 3.88 3.87 - 5.11 MIL/uL   Hemoglobin 11.8 (L) 12.0 - 15.0 g/dL   HCT 16.1 (L) 09.6 - 04.5 %   MCV 89.4 80.0 - 100.0 fL   MCH 30.4 26.0 - 34.0 pg   MCHC 34.0 30.0 - 36.0 g/dL   RDW 40.9 81.1 - 91.4 %   Platelets 154 150 - 400 K/uL   nRBC 0.0 0.0 - 0.2 %  hCG, quantitative, pregnancy     Status: None   Collection Time: 04/09/18 10:55 PM  Result Value Ref Range   hCG, Beta Chain, Quant, S 3 <5 mIU/mL    IMAGING US Ob Transvaginal  Result Date: 04/09/2018 CLINICAL DATA:  Vaginal bleeding. EXAM: TRANSVAGINAL OB ULTRASOUND TECHNIQUE: Transvaginal ultrasound was performed for complete evaluation of  the gestation as well as the maternal uterus, adnexal regions, and pelvic cul-de-sac.  COMPARISON:  None. FINDINGS: Intrauterine gestational sac: None Maternal uterus/adnexae: The ovaries are normal. The endometrium measures 3.3 mm which is normal. No free fluid. IMPRESSION: 1. No IUP seen on this study or the March 21, 2018 study. The lack of an IUP in the setting of a positive pregnancy test could represent ectopic pregnancy, early pregnancy, or recent miscarriage. Electronically Signed   By: Gerome Sam III M.D   On: 04/09/2018 23:43    MAU COURSE Orders Placed This Encounter  Procedures  . US OB Transvaginal  . Urinalysis, Routine w reflex microscopic  . CBC  . hCG, quantitative, pregnancy  . Discharge patient   No orders of the defined types were placed in this encounter.   MDM VSS, NAD RH positive CBC & HCG Ultrasound shows no IUP HCG <5 Discussed results with patient c/w miscarriage. She would like to f/u with [facility] for miscarriage f/u. Instructed patient to call office to change f/u appt.   ASSESSMENT 1. Miscarriage   2. Vaginal bleeding in pregnancy, first trimester   3. Abdominal pain during pregnancy in first trimester     PLAN Discharge home in stable condition. Bleeding precautions Follow-up Information    Ob/Gyn, [facility] Follow up.   Why:  Call to change appointment type Contact information: [address] [place] [address] [phone]          Allergies as of 04/10/2018      Reactions   Latex Itching      Medication List    STOP taking these medications   albuterol 108 (90 Base) MCG/ACT inhaler Commonly known as:  PROVENTIL HFA;VENTOLIN HFA   amoxicillin-clavulanate 500-125 MG tablet Commonly known as:  AUGMENTIN   ferrous sulfate 325 (65 FE) MG tablet   ipratropium 0.06 % nasal spray Commonly known as:  ATROVENT   predniSONE 50 MG tablet Commonly known as:  DELTASONE     TAKE these medications    prenatal vitamin w/FE, FA 27-1 MG Tabs tablet Take 1 tablet by mouth daily at 12 noon.   sertraline 50 MG tablet Commonly known as:  ZOLOFT Take 50 mg by mouth daily.        Judeth Horn, NP 04/10/2018  12:52 AM

## 2018-04-10 NOTE — Discharge Instructions (Signed)

## 2018-05-29 MED FILL — SERTRALINE HCL 50 MG TABLET: 50 | 30 days supply | Qty: 30 | Fill #0

## 2018-08-21 MED FILL — SERTRALINE HCL 50 MG TABLET: 50 | 30 days supply | Qty: 30 | Fill #1

## 2018-10-10 ENCOUNTER — Encounter (HOSPITAL_COMMUNITY): Payer: Self-pay

## 2019-03-20 ENCOUNTER — Encounter (HOSPITAL_COMMUNITY): Payer: Self-pay

## 2019-03-26 ENCOUNTER — Other Ambulatory Visit: Payer: Self-pay

## 2019-03-26 ENCOUNTER — Encounter (HOSPITAL_COMMUNITY): Payer: Self-pay | Admitting: Emergency Medicine

## 2019-03-26 ENCOUNTER — Emergency Department (HOSPITAL_COMMUNITY)
Admission: EM | Admit: 2019-03-26 | Discharge: 2019-03-26 | Disposition: A | Discharge status: Home / Self Care | Payer: No Typology Code available for payment source | Attending: Emergency Medicine | Admitting: Emergency Medicine

## 2019-03-26 ENCOUNTER — Emergency Department (HOSPITAL_COMMUNITY): Payer: No Typology Code available for payment source

## 2019-03-26 DIAGNOSIS — S060X1A Concussion with loss of consciousness of 30 minutes or less, initial encounter: Secondary | ICD-10-CM | POA: Diagnosis not present

## 2019-03-26 DIAGNOSIS — Y999 Unspecified external cause status: Secondary | ICD-10-CM | POA: Insufficient documentation

## 2019-03-26 DIAGNOSIS — Y929 Unspecified place or not applicable: Secondary | ICD-10-CM | POA: Diagnosis not present

## 2019-03-26 DIAGNOSIS — Z79899 Other long term (current) drug therapy: Secondary | ICD-10-CM | POA: Insufficient documentation

## 2019-03-26 DIAGNOSIS — S0990XA Unspecified injury of head, initial encounter: Secondary | ICD-10-CM | POA: Diagnosis present

## 2019-03-26 DIAGNOSIS — Y939 Activity, unspecified: Secondary | ICD-10-CM | POA: Diagnosis not present

## 2019-03-26 DIAGNOSIS — R55 Syncope and collapse: Secondary | ICD-10-CM

## 2019-03-26 DIAGNOSIS — X58XXXA Exposure to other specified factors, initial encounter: Secondary | ICD-10-CM | POA: Insufficient documentation

## 2019-03-26 HISTORY — DX: Anxiety disorder, unspecified: F41.9

## 2019-03-26 HISTORY — DX: Depression, unspecified: F32.A

## 2019-03-26 LAB — CBC WITH DIFFERENTIAL/PLATELET
Abs Immature Granulocytes: 0.02 10*3/uL (ref 0.00–0.07)
Basophils Absolute: 0 10*3/uL (ref 0.0–0.1)
Basophils Relative: 1 %
Eosinophils Absolute: 0.1 10*3/uL (ref 0.0–0.5)
Eosinophils Relative: 1 %
HCT: 37.3 % (ref 36.0–46.0)
Hemoglobin: 12.3 g/dL (ref 12.0–15.0)
Immature Granulocytes: 0 %
Lymphocytes Relative: 38 %
Lymphs Abs: 1.9 10*3/uL (ref 0.7–4.0)
MCH: 30.1 pg (ref 26.0–34.0)
MCHC: 33 g/dL (ref 30.0–36.0)
MCV: 91.2 fL (ref 80.0–100.0)
Monocytes Absolute: 0.3 10*3/uL (ref 0.1–1.0)
Monocytes Relative: 7 %
Neutro Abs: 2.5 10*3/uL (ref 1.7–7.7)
Neutrophils Relative %: 53 %
Platelets: 168 10*3/uL (ref 150–400)
RBC: 4.09 MIL/uL (ref 3.87–5.11)
RDW: 12.2 % (ref 11.5–15.5)
WBC: 4.8 10*3/uL (ref 4.0–10.5)
nRBC: 0 % (ref 0.0–0.2)

## 2019-03-26 LAB — COMPREHENSIVE METABOLIC PANEL
ALT: 11 U/L (ref 0–44)
AST: 13 U/L — ABNORMAL LOW (ref 15–41)
Albumin: 3.7 g/dL (ref 3.5–5.0)
Alkaline Phosphatase: 49 U/L (ref 38–126)
Anion gap: 9 (ref 5–15)
BUN: 7 mg/dL (ref 6–20)
CO2: 22 mmol/L (ref 22–32)
Calcium: 8.9 mg/dL (ref 8.9–10.3)
Chloride: 105 mmol/L (ref 98–111)
Creatinine, Ser: 0.87 mg/dL (ref 0.44–1.00)
GFR calc Af Amer: >60 mL/min (ref >60)
GFR calc non Af Amer: >60 mL/min (ref >60)
Glucose, Bld: 103 mg/dL — ABNORMAL HIGH (ref 70–99)
Potassium: 3.3 mmol/L — ABNORMAL LOW (ref 3.5–5.1)
Sodium: 136 mmol/L (ref 135–145)
Total Bilirubin: 0.5 mg/dL (ref 0.3–1.2)
Total Protein: 6.6 g/dL (ref 6.5–8.1)

## 2019-03-26 LAB — I-STAT BETA HCG BLOOD, ED (MC, WL, AP ONLY): I-stat hCG, quantitative: <5 m[IU]/mL (ref <5)

## 2019-03-26 MED ORDER — IBUPROFEN 400 MG PO TABS
600.0000 mg | ORAL_TABLET | Freq: Once | ORAL | Status: AC
  Administered 2019-03-26: 600 mg via ORAL
  Filled 2019-03-26: qty 1

## 2019-03-26 NOTE — ED Notes (Signed)
ED Provider at bedside. 

## 2019-03-26 NOTE — Discharge Instructions (Signed)
Use ice, heat, Tylenol or Motrin for pain. Return for persistent vomiting, confusion, weakness or numbness or new concerns.

## 2019-03-26 NOTE — ED Triage Notes (Signed)
Patient fell backwards after a brief syncopal episode 2 days ago , reports posterior neck pain , and frontal headache . Alert and oriented at triage with no neuro deficits at arrival .
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
## 2021-04-18 ENCOUNTER — Emergency Department (HOSPITAL_BASED_OUTPATIENT_CLINIC_OR_DEPARTMENT_OTHER)
Admission: EM | Admit: 2021-04-18 | Discharge: 2021-04-18 | Disposition: A | Discharge status: Home / Self Care | Payer: Managed Care, Other (non HMO) | Attending: Emergency Medicine | Admitting: Emergency Medicine

## 2021-04-18 ENCOUNTER — Emergency Department (HOSPITAL_BASED_OUTPATIENT_CLINIC_OR_DEPARTMENT_OTHER): Payer: Managed Care, Other (non HMO) | Admitting: Radiology

## 2021-04-18 DIAGNOSIS — Z91040 Latex allergy status: Secondary | ICD-10-CM | POA: Insufficient documentation

## 2021-04-18 DIAGNOSIS — M62838 Other muscle spasm: Secondary | ICD-10-CM | POA: Diagnosis not present

## 2021-04-18 DIAGNOSIS — M546 Pain in thoracic spine: Secondary | ICD-10-CM | POA: Insufficient documentation

## 2021-04-18 DIAGNOSIS — M542 Cervicalgia: Secondary | ICD-10-CM | POA: Diagnosis present

## 2021-04-18 DIAGNOSIS — Y92410 Unspecified street and highway as the place of occurrence of the external cause: Secondary | ICD-10-CM | POA: Diagnosis not present

## 2021-04-18 MED ORDER — KETOROLAC TROMETHAMINE 30 MG/ML IJ SOLN
30.0000 mg | Freq: Once | INTRAMUSCULAR | Status: AC
  Administered 2021-04-18: 30 mg via INTRAMUSCULAR
  Filled 2021-04-18: qty 1

## 2021-04-18 MED ORDER — NAPROXEN 500 MG PO TABS: 500.0000 mg | ORAL_TABLET | Freq: Two times a day (BID) | ORAL | 0 refills | Status: AC

## 2021-04-18 MED ORDER — CYCLOBENZAPRINE HCL 10 MG PO TABS: 10.0000 mg | ORAL_TABLET | Freq: Two times a day (BID) | ORAL | 0 refills | Status: AC | PRN
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
